# Patient Record
Sex: Female | Born: 1955 | Race: White | Hispanic: No | Smoking: Never smoker
Health system: Southern US, Community
[De-identification: ages and names within clinical notes are randomized; demographics above are authoritative.]

## PROBLEM LIST (undated history)

## (undated) DIAGNOSIS — K219 Gastro-esophageal reflux disease without esophagitis: Secondary | ICD-10-CM

## (undated) DIAGNOSIS — E039 Hypothyroidism, unspecified: Secondary | ICD-10-CM

## (undated) DIAGNOSIS — F32A Depression, unspecified: Secondary | ICD-10-CM

## (undated) DIAGNOSIS — M797 Fibromyalgia: Secondary | ICD-10-CM

## (undated) DIAGNOSIS — I10 Essential (primary) hypertension: Secondary | ICD-10-CM

## (undated) DIAGNOSIS — M3500 Sicca syndrome, unspecified: Secondary | ICD-10-CM

## (undated) DIAGNOSIS — G5600 Carpal tunnel syndrome, unspecified upper limb: Secondary | ICD-10-CM

## (undated) DIAGNOSIS — M1990 Unspecified osteoarthritis, unspecified site: Secondary | ICD-10-CM

## (undated) DIAGNOSIS — F419 Anxiety disorder, unspecified: Secondary | ICD-10-CM

## (undated) DIAGNOSIS — G4730 Sleep apnea, unspecified: Secondary | ICD-10-CM

## (undated) DIAGNOSIS — D649 Anemia, unspecified: Secondary | ICD-10-CM

## (undated) HISTORY — PX: TUBAL LIGATION: SHX77

## (undated) HISTORY — PX: COLONOSCOPY: SHX174

## (undated) HISTORY — PX: WRIST FRACTURE SURGERY: SHX121

## (undated) HISTORY — PX: APPENDECTOMY: SHX54

## (undated) HISTORY — PX: CHOLECYSTECTOMY: SHX55

---

## 2020-08-28 ENCOUNTER — Encounter (HOSPITAL_COMMUNITY)
Admission: RE | Disposition: A | Discharge status: Home / Self Care | Payer: Self-pay | Source: Home / Self Care | Attending: Internal Medicine

## 2020-08-28 ENCOUNTER — Ambulatory Visit (HOSPITAL_COMMUNITY): Payer: Medicare Other | Admitting: Anesthesiology

## 2020-08-28 ENCOUNTER — Ambulatory Visit (HOSPITAL_COMMUNITY): Payer: Medicare Other

## 2020-08-28 ENCOUNTER — Inpatient Hospital Stay (HOSPITAL_COMMUNITY)
Admission: RE | Admit: 2020-08-28 | Discharge: 2020-09-01 | DRG: 159 | Disposition: A | Discharge status: Home / Self Care | Payer: Medicare Other | Attending: Internal Medicine | Admitting: Internal Medicine

## 2020-08-28 ENCOUNTER — Other Ambulatory Visit: Payer: Self-pay

## 2020-08-28 ENCOUNTER — Encounter (HOSPITAL_COMMUNITY): Payer: Self-pay | Admitting: Oral Surgery

## 2020-08-28 ENCOUNTER — Ambulatory Visit: Payer: Self-pay | Admitting: Oral Surgery

## 2020-08-28 DIAGNOSIS — E876 Hypokalemia: Secondary | ICD-10-CM | POA: Diagnosis not present

## 2020-08-28 DIAGNOSIS — Z20822 Contact with and (suspected) exposure to covid-19: Secondary | ICD-10-CM | POA: Diagnosis present

## 2020-08-28 DIAGNOSIS — G4700 Insomnia, unspecified: Secondary | ICD-10-CM | POA: Diagnosis present

## 2020-08-28 DIAGNOSIS — K219 Gastro-esophageal reflux disease without esophagitis: Secondary | ICD-10-CM | POA: Diagnosis present

## 2020-08-28 DIAGNOSIS — Z79890 Hormone replacement therapy (postmenopausal): Secondary | ICD-10-CM

## 2020-08-28 DIAGNOSIS — R197 Diarrhea, unspecified: Secondary | ICD-10-CM | POA: Diagnosis present

## 2020-08-28 DIAGNOSIS — Z9049 Acquired absence of other specified parts of digestive tract: Secondary | ICD-10-CM

## 2020-08-28 DIAGNOSIS — M272 Inflammatory conditions of jaws: Secondary | ICD-10-CM

## 2020-08-28 DIAGNOSIS — J45909 Unspecified asthma, uncomplicated: Secondary | ICD-10-CM | POA: Diagnosis present

## 2020-08-28 DIAGNOSIS — F32A Depression, unspecified: Secondary | ICD-10-CM | POA: Diagnosis present

## 2020-08-28 DIAGNOSIS — E669 Obesity, unspecified: Secondary | ICD-10-CM | POA: Diagnosis present

## 2020-08-28 DIAGNOSIS — E039 Hypothyroidism, unspecified: Secondary | ICD-10-CM | POA: Diagnosis not present

## 2020-08-28 DIAGNOSIS — R000 Tachycardia, unspecified: Secondary | ICD-10-CM | POA: Diagnosis present

## 2020-08-28 DIAGNOSIS — K122 Cellulitis and abscess of mouth: Principal | ICD-10-CM | POA: Diagnosis present

## 2020-08-28 DIAGNOSIS — Z6835 Body mass index (BMI) 35.0-35.9, adult: Secondary | ICD-10-CM

## 2020-08-28 DIAGNOSIS — I10 Essential (primary) hypertension: Secondary | ICD-10-CM | POA: Diagnosis not present

## 2020-08-28 DIAGNOSIS — Z79899 Other long term (current) drug therapy: Secondary | ICD-10-CM

## 2020-08-28 DIAGNOSIS — M797 Fibromyalgia: Secondary | ICD-10-CM | POA: Diagnosis present

## 2020-08-28 DIAGNOSIS — D72829 Elevated white blood cell count, unspecified: Secondary | ICD-10-CM | POA: Diagnosis present

## 2020-08-28 DIAGNOSIS — Z880 Allergy status to penicillin: Secondary | ICD-10-CM

## 2020-08-28 DIAGNOSIS — G4730 Sleep apnea, unspecified: Secondary | ICD-10-CM | POA: Diagnosis present

## 2020-08-28 DIAGNOSIS — D72828 Other elevated white blood cell count: Secondary | ICD-10-CM | POA: Diagnosis not present

## 2020-08-28 DIAGNOSIS — F419 Anxiety disorder, unspecified: Secondary | ICD-10-CM | POA: Diagnosis present

## 2020-08-28 DIAGNOSIS — G8929 Other chronic pain: Secondary | ICD-10-CM | POA: Diagnosis present

## 2020-08-28 DIAGNOSIS — M545 Low back pain, unspecified: Secondary | ICD-10-CM | POA: Diagnosis present

## 2020-08-28 DIAGNOSIS — M3500 Sicca syndrome, unspecified: Secondary | ICD-10-CM | POA: Diagnosis present

## 2020-08-28 HISTORY — DX: Fibromyalgia: M79.7

## 2020-08-28 HISTORY — DX: Unspecified osteoarthritis, unspecified site: M19.90

## 2020-08-28 HISTORY — DX: Depression, unspecified: F32.A

## 2020-08-28 HISTORY — DX: Sjogren syndrome, unspecified: M35.00

## 2020-08-28 HISTORY — DX: Gastro-esophageal reflux disease without esophagitis: K21.9

## 2020-08-28 HISTORY — PX: TOOTH EXTRACTION: SHX859

## 2020-08-28 HISTORY — DX: Hypothyroidism, unspecified: E03.9

## 2020-08-28 HISTORY — DX: Essential (primary) hypertension: I10

## 2020-08-28 HISTORY — DX: Anxiety disorder, unspecified: F41.9

## 2020-08-28 HISTORY — DX: Carpal tunnel syndrome, unspecified upper limb: G56.00

## 2020-08-28 HISTORY — PX: INCISION AND DRAINAGE ABSCESS: SHX5864

## 2020-08-28 HISTORY — PX: DENTAL SURGERY: SHX609

## 2020-08-28 HISTORY — DX: Anemia, unspecified: D64.9

## 2020-08-28 HISTORY — DX: Sleep apnea, unspecified: G47.30

## 2020-08-28 LAB — BASIC METABOLIC PANEL
Anion gap: 12 (ref 5–15)
BUN: 6 mg/dL — ABNORMAL LOW (ref 8–23)
CO2: 25 mmol/L (ref 22–32)
Calcium: 10.3 mg/dL (ref 8.9–10.3)
Chloride: 100 mmol/L (ref 98–111)
Creatinine, Ser: 0.82 mg/dL (ref 0.44–1.00)
GFR, Estimated: >60 mL/min (ref >60)
Glucose, Bld: 115 mg/dL — ABNORMAL HIGH (ref 70–99)
Potassium: 3 mmol/L — ABNORMAL LOW (ref 3.5–5.1)
Sodium: 137 mmol/L (ref 135–145)

## 2020-08-28 LAB — CBC WITH DIFFERENTIAL/PLATELET
Abs Immature Granulocytes: 0.1 10*3/uL — ABNORMAL HIGH (ref 0.00–0.07)
Basophils Absolute: 0.1 10*3/uL (ref 0.0–0.1)
Basophils Relative: 0 %
Eosinophils Absolute: 0 10*3/uL (ref 0.0–0.5)
Eosinophils Relative: 0 %
HCT: 38.8 % (ref 36.0–46.0)
Hemoglobin: 12.2 g/dL (ref 12.0–15.0)
Immature Granulocytes: 1 %
Lymphocytes Relative: 13 %
Lymphs Abs: 1.9 10*3/uL (ref 0.7–4.0)
MCH: 29.1 pg (ref 26.0–34.0)
MCHC: 31.4 g/dL (ref 30.0–36.0)
MCV: 92.6 fL (ref 80.0–100.0)
Monocytes Absolute: 1.2 10*3/uL — ABNORMAL HIGH (ref 0.1–1.0)
Monocytes Relative: 8 %
Neutro Abs: 11.7 10*3/uL — ABNORMAL HIGH (ref 1.7–7.7)
Neutrophils Relative %: 78 %
Platelets: 444 10*3/uL — ABNORMAL HIGH (ref 150–400)
RBC: 4.19 MIL/uL (ref 3.87–5.11)
RDW: 14 % (ref 11.5–15.5)
WBC: 15 10*3/uL — ABNORMAL HIGH (ref 4.0–10.5)
nRBC: 0 % (ref 0.0–0.2)

## 2020-08-28 LAB — SARS CORONAVIRUS 2 BY RT PCR (HOSPITAL ORDER, PERFORMED IN ~~LOC~~ HOSPITAL LAB): SARS Coronavirus 2: NEGATIVE

## 2020-08-28 SURGERY — DENTAL RESTORATION/EXTRACTIONS
Anesthesia: General | Site: Mouth

## 2020-08-28 MED ORDER — LACTATED RINGERS IV SOLN: INTRAVENOUS | Status: DC

## 2020-08-28 MED ORDER — ACETAMINOPHEN 325 MG PO TABS
650.0000 mg | ORAL_TABLET | Freq: Four times a day (QID) | ORAL | Status: DC | PRN
  Administered 2020-08-28 – 2020-08-29 (×2): 650 mg via ORAL
  Filled 2020-08-28 (×2): qty 2

## 2020-08-28 MED ORDER — FENTANYL CITRATE (PF) 100 MCG/2ML IJ SOLN
INTRAMUSCULAR | Status: AC
  Filled 2020-08-28: qty 2

## 2020-08-28 MED ORDER — LIDOCAINE 2% (20 MG/ML) 5 ML SYRINGE
INTRAMUSCULAR | Status: AC
  Filled 2020-08-28: qty 5

## 2020-08-28 MED ORDER — ONDANSETRON HCL 4 MG/2ML IJ SOLN: 4.0000 mg | Freq: Four times a day (QID) | INTRAMUSCULAR | Status: DC | PRN

## 2020-08-28 MED ORDER — PROPOFOL 10 MG/ML IV BOLUS
INTRAVENOUS | Status: DC | PRN
  Administered 2020-08-28: 200 mg via INTRAVENOUS

## 2020-08-28 MED ORDER — 0.9 % SODIUM CHLORIDE (POUR BTL) OPTIME
TOPICAL | Status: DC | PRN
  Administered 2020-08-28: 1000 mL

## 2020-08-28 MED ORDER — CLONAZEPAM 0.5 MG PO TABS: 0.5000 mg | ORAL_TABLET | Freq: Three times a day (TID) | ORAL | Status: DC | PRN

## 2020-08-28 MED ORDER — ENOXAPARIN SODIUM 40 MG/0.4ML ~~LOC~~ SOLN
40.0000 mg | SUBCUTANEOUS | Status: DC
  Administered 2020-08-29 – 2020-09-01 (×4): 40 mg via SUBCUTANEOUS
  Filled 2020-08-28 (×4): qty 0.4

## 2020-08-28 MED ORDER — POTASSIUM CHLORIDE 10 MEQ/100ML IV SOLN
10.0000 meq | INTRAVENOUS | Status: AC
  Administered 2020-08-28 – 2020-08-29 (×4): 10 meq via INTRAVENOUS
  Filled 2020-08-28 (×4): qty 100

## 2020-08-28 MED ORDER — DULOXETINE HCL 60 MG PO CPEP
60.0000 mg | ORAL_CAPSULE | Freq: Two times a day (BID) | ORAL | Status: DC
  Administered 2020-08-28 – 2020-09-01 (×8): 60 mg via ORAL
  Filled 2020-08-28 (×8): qty 1

## 2020-08-28 MED ORDER — SODIUM CHLORIDE 0.9 % IV SOLN
2.0000 g | INTRAVENOUS | Status: DC
  Administered 2020-08-29 – 2020-08-31 (×3): 2 g via INTRAVENOUS
  Filled 2020-08-28 (×4): qty 20

## 2020-08-28 MED ORDER — METRONIDAZOLE IN NACL 5-0.79 MG/ML-% IV SOLN
500.0000 mg | INTRAVENOUS | Status: AC
  Administered 2020-08-28: 500 mg via INTRAVENOUS
  Filled 2020-08-28: qty 100

## 2020-08-28 MED ORDER — AMISULPRIDE (ANTIEMETIC) 5 MG/2ML IV SOLN: 10.0000 mg | Freq: Once | INTRAVENOUS | Status: DC | PRN

## 2020-08-28 MED ORDER — VITAMIN B-12 1000 MCG PO TABS
1000.0000 ug | ORAL_TABLET | Freq: Every day | ORAL | Status: DC
  Administered 2020-08-29 – 2020-09-01 (×4): 1000 ug via ORAL
  Filled 2020-08-28 (×4): qty 1

## 2020-08-28 MED ORDER — ACETAMINOPHEN 10 MG/ML IV SOLN
INTRAVENOUS | Status: DC | PRN
  Administered 2020-08-28: 1000 mg via INTRAVENOUS

## 2020-08-28 MED ORDER — CHLORHEXIDINE GLUCONATE 0.12 % MT SOLN: 15.0000 mL | Freq: Once | OROMUCOSAL | Status: AC

## 2020-08-28 MED ORDER — STERILE WATER FOR IRRIGATION IR SOLN
Status: DC | PRN
  Administered 2020-08-28: 1000 mL

## 2020-08-28 MED ORDER — MIDAZOLAM HCL 2 MG/2ML IJ SOLN
INTRAMUSCULAR | Status: AC
  Filled 2020-08-28: qty 2

## 2020-08-28 MED ORDER — MIDAZOLAM HCL 2 MG/2ML IJ SOLN
INTRAMUSCULAR | Status: DC | PRN
  Administered 2020-08-28: 2 mg via INTRAVENOUS

## 2020-08-28 MED ORDER — PROPOFOL 10 MG/ML IV BOLUS
INTRAVENOUS | Status: AC
  Filled 2020-08-28: qty 20

## 2020-08-28 MED ORDER — VITAMIN D 25 MCG (1000 UNIT) PO TABS
2000.0000 [IU] | ORAL_TABLET | Freq: Every day | ORAL | Status: DC
  Administered 2020-08-29 – 2020-09-01 (×4): 2000 [IU] via ORAL
  Filled 2020-08-28 (×4): qty 2

## 2020-08-28 MED ORDER — SODIUM CHLORIDE 0.9 % IV SOLN
2.0000 g | INTRAVENOUS | Status: AC
  Administered 2020-08-28: 2 g via INTRAVENOUS
  Filled 2020-08-28: qty 20

## 2020-08-28 MED ORDER — DEXAMETHASONE SODIUM PHOSPHATE 10 MG/ML IJ SOLN
INTRAMUSCULAR | Status: DC | PRN
  Administered 2020-08-28: 10 mg via INTRAVENOUS

## 2020-08-28 MED ORDER — SUCCINYLCHOLINE CHLORIDE 200 MG/10ML IV SOSY
PREFILLED_SYRINGE | INTRAVENOUS | Status: DC | PRN
  Administered 2020-08-28: 120 mg via INTRAVENOUS

## 2020-08-28 MED ORDER — FENTANYL CITRATE (PF) 100 MCG/2ML IJ SOLN
25.0000 ug | INTRAMUSCULAR | Status: DC | PRN
  Administered 2020-08-28: 50 ug via INTRAVENOUS

## 2020-08-28 MED ORDER — ONDANSETRON HCL 4 MG/2ML IJ SOLN
INTRAMUSCULAR | Status: AC
  Filled 2020-08-28: qty 2

## 2020-08-28 MED ORDER — FENTANYL CITRATE (PF) 250 MCG/5ML IJ SOLN
INTRAMUSCULAR | Status: DC | PRN
  Administered 2020-08-28 (×2): 50 ug via INTRAVENOUS
  Administered 2020-08-28: 100 ug via INTRAVENOUS
  Administered 2020-08-28: 50 ug via INTRAVENOUS

## 2020-08-28 MED ORDER — FENTANYL CITRATE (PF) 250 MCG/5ML IJ SOLN
INTRAMUSCULAR | Status: AC
  Filled 2020-08-28: qty 5

## 2020-08-28 MED ORDER — OXYCODONE HCL 5 MG PO TABS: 5.0000 mg | ORAL_TABLET | Freq: Once | ORAL | Status: DC | PRN

## 2020-08-28 MED ORDER — POTASSIUM CITRATE ER 10 MEQ (1080 MG) PO TBCR
20.0000 meq | EXTENDED_RELEASE_TABLET | Freq: Three times a day (TID) | ORAL | Status: DC
  Filled 2020-08-28: qty 2

## 2020-08-28 MED ORDER — OXYMETAZOLINE HCL 0.05 % NA SOLN
NASAL | Status: AC
  Filled 2020-08-28: qty 30

## 2020-08-28 MED ORDER — HYDROCHLOROTHIAZIDE 25 MG PO TABS
25.0000 mg | ORAL_TABLET | Freq: Every day | ORAL | Status: DC
  Administered 2020-08-28 – 2020-09-01 (×5): 25 mg via ORAL
  Filled 2020-08-28 (×5): qty 1

## 2020-08-28 MED ORDER — ALBUTEROL SULFATE HFA 108 (90 BASE) MCG/ACT IN AERS
2.0000 | INHALATION_SPRAY | Freq: Four times a day (QID) | RESPIRATORY_TRACT | Status: DC | PRN
  Filled 2020-08-28: qty 6.7

## 2020-08-28 MED ORDER — SUCCINYLCHOLINE CHLORIDE 200 MG/10ML IV SOSY
PREFILLED_SYRINGE | INTRAVENOUS | Status: AC
  Filled 2020-08-28: qty 10

## 2020-08-28 MED ORDER — ROCURONIUM BROMIDE 10 MG/ML (PF) SYRINGE
PREFILLED_SYRINGE | INTRAVENOUS | Status: AC
  Filled 2020-08-28: qty 10

## 2020-08-28 MED ORDER — FENTANYL CITRATE (PF) 100 MCG/2ML IJ SOLN: 25.0000 ug | INTRAMUSCULAR | Status: DC | PRN

## 2020-08-28 MED ORDER — CHLORHEXIDINE GLUCONATE CLOTH 2 % EX PADS: 6.0000 | MEDICATED_PAD | Freq: Once | CUTANEOUS | Status: DC

## 2020-08-28 MED ORDER — KETOROLAC TROMETHAMINE 30 MG/ML IJ SOLN
INTRAMUSCULAR | Status: DC | PRN
  Administered 2020-08-28: 30 mg via INTRAVENOUS

## 2020-08-28 MED ORDER — ORAL CARE MOUTH RINSE
15.0000 mL | Freq: Once | OROMUCOSAL | Status: AC
  Administered 2020-08-28: 15 mL via OROMUCOSAL

## 2020-08-28 MED ORDER — PROMETHAZINE HCL 25 MG/ML IJ SOLN: 6.2500 mg | INTRAMUSCULAR | Status: DC | PRN

## 2020-08-28 MED ORDER — VERAPAMIL HCL ER 120 MG PO TBCR
120.0000 mg | EXTENDED_RELEASE_TABLET | Freq: Every day | ORAL | Status: DC
  Administered 2020-08-28 – 2020-09-01 (×5): 120 mg via ORAL
  Filled 2020-08-28 (×5): qty 1

## 2020-08-28 MED ORDER — KETOROLAC TROMETHAMINE 30 MG/ML IJ SOLN
INTRAMUSCULAR | Status: AC
  Filled 2020-08-28: qty 1

## 2020-08-28 MED ORDER — PANTOPRAZOLE SODIUM 40 MG PO TBEC
40.0000 mg | DELAYED_RELEASE_TABLET | Freq: Every day | ORAL | Status: DC
  Administered 2020-08-28 – 2020-09-01 (×5): 40 mg via ORAL
  Filled 2020-08-28 (×5): qty 1

## 2020-08-28 MED ORDER — DEXAMETHASONE SODIUM PHOSPHATE 10 MG/ML IJ SOLN
INTRAMUSCULAR | Status: AC
  Filled 2020-08-28: qty 1

## 2020-08-28 MED ORDER — ONDANSETRON HCL 4 MG/2ML IJ SOLN
INTRAMUSCULAR | Status: DC | PRN
  Administered 2020-08-28: 4 mg via INTRAVENOUS

## 2020-08-28 MED ORDER — LIDOCAINE-EPINEPHRINE (PF) 2 %-1:200000 IJ SOLN
INTRAMUSCULAR | Status: DC | PRN
  Administered 2020-08-28: 5 mL

## 2020-08-28 MED ORDER — ARIPIPRAZOLE 5 MG PO TABS
15.0000 mg | ORAL_TABLET | Freq: Every day | ORAL | Status: DC
  Administered 2020-08-28 – 2020-09-01 (×5): 15 mg via ORAL
  Filled 2020-08-28 (×5): qty 1

## 2020-08-28 MED ORDER — ONDANSETRON HCL 4 MG PO TABS: 4.0000 mg | ORAL_TABLET | Freq: Four times a day (QID) | ORAL | Status: DC | PRN

## 2020-08-28 MED ORDER — POTASSIUM CHLORIDE CRYS ER 20 MEQ PO TBCR
20.0000 meq | EXTENDED_RELEASE_TABLET | Freq: Three times a day (TID) | ORAL | Status: AC
  Administered 2020-08-29 (×3): 20 meq via ORAL
  Filled 2020-08-28 (×3): qty 1

## 2020-08-28 MED ORDER — WHITE PETROLATUM EX OINT
TOPICAL_OINTMENT | CUTANEOUS | Status: AC
  Filled 2020-08-28: qty 28.35

## 2020-08-28 MED ORDER — LEVOTHYROXINE SODIUM 150 MCG PO TABS
150.0000 ug | ORAL_TABLET | Freq: Every day | ORAL | Status: DC
  Administered 2020-08-29 – 2020-09-01 (×4): 150 ug via ORAL
  Filled 2020-08-28 (×2): qty 1
  Filled 2020-08-28 (×3): qty 2
  Filled 2020-08-28: qty 1
  Filled 2020-08-28: qty 2
  Filled 2020-08-28: qty 1

## 2020-08-28 MED ORDER — LIDOCAINE-EPINEPHRINE 2 %-1:100000 IJ SOLN
INTRAMUSCULAR | Status: AC
  Filled 2020-08-28: qty 1

## 2020-08-28 MED ORDER — ROCURONIUM BROMIDE 10 MG/ML (PF) SYRINGE
PREFILLED_SYRINGE | INTRAVENOUS | Status: DC | PRN
  Administered 2020-08-28: 50 mg via INTRAVENOUS

## 2020-08-28 MED ORDER — SUGAMMADEX SODIUM 200 MG/2ML IV SOLN
INTRAVENOUS | Status: DC | PRN
  Administered 2020-08-28: 200 mg via INTRAVENOUS

## 2020-08-28 MED ORDER — LIDOCAINE 2% (20 MG/ML) 5 ML SYRINGE
INTRAMUSCULAR | Status: DC | PRN
  Administered 2020-08-28: 100 mg via INTRAVENOUS

## 2020-08-28 MED ORDER — ACETAMINOPHEN 10 MG/ML IV SOLN
INTRAVENOUS | Status: AC
  Filled 2020-08-28: qty 100

## 2020-08-28 MED ORDER — OXYCODONE HCL 5 MG/5ML PO SOLN: 5.0000 mg | Freq: Once | ORAL | Status: DC | PRN

## 2020-08-28 MED ORDER — METRONIDAZOLE IN NACL 5-0.79 MG/ML-% IV SOLN
500.0000 mg | Freq: Three times a day (TID) | INTRAVENOUS | Status: DC
  Administered 2020-08-29 – 2020-08-30 (×4): 500 mg via INTRAVENOUS
  Filled 2020-08-28 (×4): qty 100

## 2020-08-28 MED ORDER — IOHEXOL 300 MG/ML  SOLN
75.0000 mL | Freq: Once | INTRAMUSCULAR | Status: AC | PRN
  Administered 2020-08-28: 75 mL via INTRAVENOUS

## 2020-08-28 MED ORDER — TRAZODONE HCL 150 MG PO TABS
300.0000 mg | ORAL_TABLET | Freq: Every day | ORAL | Status: DC
  Administered 2020-08-28 – 2020-08-31 (×4): 300 mg via ORAL
  Filled 2020-08-28 (×5): qty 2

## 2020-08-28 SURGICAL SUPPLY — 30 items
BLADE SURG 15 STRL LF DISP TIS (BLADE) ×2 IMPLANT
BLADE SURG 15 STRL SS (BLADE) ×1
BNDG GAUZE ELAST 4 BULKY (GAUZE/BANDAGES/DRESSINGS) ×3 IMPLANT
BUR CROSS CUT FISSURE 1.6 (BURR) ×3 IMPLANT
CANISTER SUCT 3000ML PPV (MISCELLANEOUS) ×3 IMPLANT
CLEANER TIP ELECTROSURG 2X2 (MISCELLANEOUS) ×3 IMPLANT
COVER SURGICAL LIGHT HANDLE (MISCELLANEOUS) ×3 IMPLANT
DRAIN PENROSE 1/4X12 LTX STRL (WOUND CARE) ×3 IMPLANT
ELECT NEEDLE BLADE 2-5/6 (NEEDLE) ×3 IMPLANT
ELECT REM PT RETURN 9FT ADLT (ELECTROSURGICAL) ×3
ELECTRODE REM PT RTRN 9FT ADLT (ELECTROSURGICAL) ×2 IMPLANT
GLOVE ORTHO TXT STRL SZ7.5 (GLOVE) ×3 IMPLANT
GOWN STRL REUS W/ TWL LRG LVL3 (GOWN DISPOSABLE) ×4 IMPLANT
GOWN STRL REUS W/TWL LRG LVL3 (GOWN DISPOSABLE) ×2
KIT BASIN OR (CUSTOM PROCEDURE TRAY) ×3 IMPLANT
KIT TURNOVER KIT B (KITS) ×3 IMPLANT
NEEDLE 18GX1X1/2 (RX/OR ONLY) (NEEDLE) ×3 IMPLANT
NEEDLE BLUNT 18X1 FOR OR ONLY (NEEDLE) ×3 IMPLANT
NEEDLE PRECISIONGLIDE 27X1.5 (NEEDLE) ×3 IMPLANT
NS IRRIG 1000ML POUR BTL (IV SOLUTION) ×3 IMPLANT
PAD ARMBOARD 7.5X6 YLW CONV (MISCELLANEOUS) ×6 IMPLANT
PENCIL BUTTON HOLSTER BLD 10FT (ELECTRODE) ×3 IMPLANT
STAPLER VISISTAT 35W (STAPLE) ×3 IMPLANT
SUCTION FRAZIER TIP 8 FR DISP (SUCTIONS) ×1
SUCTION TUBE FRAZIER 8FR DISP (SUCTIONS) ×2 IMPLANT
SUT CHROMIC 3 0 SH 27 (SUTURE) ×3 IMPLANT
SUT ETHILON 3 0 PS 1 (SUTURE) ×3 IMPLANT
SYR 50ML LL SCALE MARK (SYRINGE) ×3 IMPLANT
TRAY ENT MC OR (CUSTOM PROCEDURE TRAY) ×3 IMPLANT
WATER STERILE IRR 1000ML POUR (IV SOLUTION) ×3 IMPLANT

## 2020-08-28 NOTE — Procedures (Signed)
PRE-OPERATIVE DIAGNOSIS:  Left submandibular/sublingual infection with carious, non-restorable teeth #19-21, 23-24  POST-OPERATIVE DIAGNOSIS:  Same   SURGEON:  Surgeon(s) and Role:    Ross Marcus, Lavell Anchors, DMD - Primary  Procedure: Surgical extraction teeth #19-21, 23-24  ([id number] x 5) Extra-oral I&D of sublingual abscess (68341) Extra-oral I&D of submandibular abscess (96222)  Description of Operation/Procedure:   The patient was encountered in [facility] Room 8.  General anesthesia was induced and an oral endotracheal tube was secured in the standard fashion.  The table was moved slightly away from anesthesia and the patient was properly padded, relieving all pressure points.  A formal time-out was executed.  2% lidocaine with 1:100,000 epinephrine was infiltrated into the proposed surgical sites.  The patient was prepped and draped in the standard sterile fashion and a throat pack was placed.            Using an 18 gauge needle, aspiration of the swelling was performed via an extraoral approach, yielding no purulence.  Intraoral swabs were taken and were sent for aerobic and anaerobic cultures and a stat Gram stain.   Attention was then directed intraorally to the lower left.  A full thickness mucoperiosteal flap was elevated.  Teeth #19-21, 23-2 were then removed with standard elevator and forceps technique without complication. The extraction sites were thoroughly curetted, bone was smoothed, and the sites thoroughly irrigated.   Transcutaneous drainage sites were then carefully marked beneath the inferior border of the mandible to allow dependent drainage, avoid neurovascular structures, and to promote esthetics.  Incisions were made through skin and subcutaneous tissues and hemostasis was obtained.  The platysma was identified and divided.  Blunt dissection was carried to the mandible with intraoral digital guidance.  The following spaces were bluntly explored yielding purulent drainage:  submandibular, sublingual.   Intraoral access was used to assure proper 1/4'" Penrose drain placement into each fascial space.  A total of 2 drains were placed and secured with 3-0 nylon sutures in the standard fashion to allow for post-surgical advancement when appropriate.  All areas were thoroughly irrigated.  The full thickness mucoperiosteal flap was reapproximated with 3-0 chromic gut suture.  A burn net dressing was fashioned to secure kerlix fluffs over the extraoral drains.   The oral cavity was suctioned free of all debris and secretions.  The teeth were brushed. The throat pack was removed and an orogastric tube was passed to evacuate the stomach contents.  Sponge and needle counts were correct x 2.  Care of the patient was turned over to the Anesthesia team for uneventful extubation and delivery of the patient to the PACU in stable condition.  ANESTHESIA:   general EBL:  50 mL  DRAINS: Penrose drain in the left neck X 2 LOCAL MEDICATIONS USED:  LIDOCAINE 2% w/ 1:100000 epi SPECIMEN:  Aspirate - cultures for anaerobes, aerobes, and fungal PLAN OF CARE: Return to floor PATIENT DISPOSITION:  PACU - hemodynamically stable. Delay start of Pharmacological VTE agent (>24hrs) due to surgical blood loss or risk of bleeding: no  OMFS Recommendations -Liquid diet POD #0, advance to soft mechanical diet POD#1 -Peridex (chlorohexidine) mouthrnise QID -Change Kerlix dressing as needed -Continue Ceftriaxone + Flagyl -Follow cultures -Daily CBC w/ diff

## 2020-08-28 NOTE — H&P (Addendum)
History and Physical   Kim Gordon [facility] FTD:[id number] DOB: [birth year] DOA: 08/28/2020  PCP: Majel Homer, MD  Patient coming from: Home  I have personally briefly reviewed patient's old medical records in [facility] EMR.  Chief Concern: tooth ache  HPI: Kim Gordon is a 64 y.o. female with medical history significant for hypertension, hypothyroid, obesity, anxiety/depression presented for chief concern of tooth pain in the left lower jaw.  She reports this has been ongoing for several weeks.  She reports that her tooth broke.  She does not know why her tooth broke as she did not eat anything hard.  Review of system was negative for fever, chills, headache, vision changes, nausea, vomiting, chest pain, shortness of breath, abdominal pain, constipation, dysuria, hematuria, insomnia.  She endorses chronic low back pain and watery stools for 3 to 4 days due to only drinking liquids.  She reports this is never happened before.  She was taken to the OR by Dr. Ross Marcus.  She states that she did not take any of her medications this morning including her blood pressure medicine or her thyroid medicine.  Social history: Denies tobacco use, alcohol use and recreational drug use.  Discussed with dental surgeon requesting admission for medical management  Review of Systems: As per HPI otherwise 10 point review of systems negative.   Past Medical History:  Diagnosis Date  . Anemia   . Anxiety   . Arthritis   . Carpal tunnel syndrome   . Depression   . Fibromyalgia   . GERD (gastroesophageal reflux disease)   . Hypertension   . Hypothyroidism   . Sjogren's syndrome (HCC)   . Sleep apnea    Past Surgical History:  Procedure Laterality Date  . APPENDECTOMY    . CHOLECYSTECTOMY    . COLONOSCOPY    . TUBAL LIGATION    . WRIST FRACTURE SURGERY Left    Social History:  reports that she has never smoked. She has never used smokeless tobacco. She reports that she does not drink alcohol  and does not use drugs.  Allergies  Allergen Reactions  . Drug Ingredient [Sulfamethoxazole-Trimethoprim] Hives  . Penicillins Hives   History reviewed. No pertinent family history. Family history: Family history reviewed and no periodontal abscess  Prior to Admission medications   Medication Sig Start Date End Date Taking? Authorizing Provider  ARIPiprazole (ABILIFY) 15 MG tablet Take 15 mg by mouth daily. 02/24/20  Yes [provider]  benztropine (COGENTIN) 0.5 MG tablet Take 0.5 mg by mouth in the morning and at bedtime. 06/22/19  Yes [provider]  Cholecalciferol 50 MCG (2000 UT) CAPS Take 2,000 Units by mouth daily. 07/05/19  Yes [provider]  clonazePAM (KLONOPIN) 0.5 MG tablet Take 0.5 mg by mouth 3 (three) times daily as needed for anxiety. 08/24/20  Yes [provider]  cyanocobalamin 1000 MCG tablet Take 1,000 mcg by mouth daily. 07/05/19  Yes [provider]  DULoxetine (CYMBALTA) 60 MG capsule Take 60 mg by mouth 2 (two) times daily. 05/03/20  Yes [provider]  esomeprazole (NEXIUM) 40 MG capsule Take 40 mg by mouth daily. 07/24/20  Yes [provider]  HYDROcodone-acetaminophen (NORCO) 7.5-325 MG tablet Take 1 tablet by mouth every 6 (six) hours as needed for pain. 08/24/20  Yes [provider]  levothyroxine (SYNTHROID) 150 MCG tablet Take 150 mcg by mouth daily. 08/27/20  Yes [provider]  trazodone (DESYREL) 300 MG tablet Take 300 mg by mouth at  bedtime. 03/29/20  Yes [provider]  verapamil (CALAN-SR) 120 MG CR tablet Take 120 mg by mouth daily.   Yes [provider]  albuterol (VENTOLIN HFA) 108 (90 Base) MCG/ACT inhaler Inhale 2 puffs into the lungs every 6 (six) hours as needed for wheezing or shortness of breath. 04/15/19   [provider]  hydrochlorothiazide (HYDRODIURIL) 25 MG tablet Take 25 mg by mouth daily. 02/24/20   [provider]   Physical  Exam: Vitals:   08/28/20 1327 08/28/20 1739 08/28/20 1745  BP: (!) 157/79 (!) 180/76 (!) 164/88  Pulse: 79 93 88  Resp: 18 (!) 21 13  Temp: 97.9 F (36.6 C) 98 F (36.7 C)   TempSrc: Temporal    SpO2: 97% 100% 100%  Weight: 99.8 kg    Height: 5\' 6"  (1.676 m)     Constitutional: NAD, calm, comfortable Eyes: PERRL, lids and conjunctivae normal ENMT: Mucous membranes are moist. Posterior pharynx clear of any exudate or lesions. Poor dentition with multiple caries. Packing in place in the left posterior mandibular space with drains through to the external facial skin of the left jaw Neck: normal, supple, no masses, no thyromegaly Respiratory: clear to auscultation bilaterally, no wheezing, no crackles. Normal respiratory effort. No accessory muscle use.  Cardiovascular: Regular rate and rhythm, no murmurs / rubs / gallops. No extremity edema. 2+ pedal pulses. No carotid bruits.  Abdomen: Obese abdomen no tenderness, no masses palpated. No hepatosplenomegaly. Bowel sounds positive.  Musculoskeletal: no clubbing / cyanosis. No joint deformity upper and lower extremities. Good ROM, no contractures. Normal muscle tone.  Skin: no rashes, lesions, ulcers. No induration Neurologic: CN 2-12 grossly intact. Sensation intact. Strength 5/5 in all 4.  Psychiatric: Normal judgment and insight. Alert and oriented x 3. Normal mood.   Labs on Admission: I have personally reviewed following labs and imaging studies  CBC: Recent Labs  Lab 08/28/20 1318  WBC 15.0*  NEUTROABS 11.7*  HGB 12.2  HCT 38.8  MCV 92.6  PLT 444*   Basic Metabolic Panel: Recent Labs  Lab 08/28/20 1318  NA 137  K 3.0*  CL 100  CO2 25  GLUCOSE 115*  BUN 6*  CREATININE 0.82  CALCIUM 10.3   Radiological Exams on Admission: Personally reviewed and I agree with radiologist reading as below.  DG Orthopantogram  Result Date: 08/28/2020 CLINICAL DATA:  Infection.  Abscesses. EXAM: ORTHOPANTOGRAM/PANORAMIC COMPARISON:   None. FINDINGS: Maxillary dentition: 1 and 2 absent. Previous root canal tooth 4 with advanced decay. Advanced decay of tooth 6. Missing teeth 13 through 16. Mandibular dentition: Crown tooth 17. Previous root canals at 18 and 19 with advanced decay. Advanced decay of tooth 20 with periapical lucency. Advanced decay teeth 23 and 24. Advanced decay tooth 25. Advanced decay tooth 28 with periapical lucency. Teeth 29 through 32 absent. IMPRESSION: Advanced dental and periodontal disease as above. Electronically Signed   By: 13/11/2019 M.D.   On: 08/28/2020 14:16   CT SOFT TISSUE NECK W CONTRAST  Result Date: 08/28/2020 CLINICAL DATA:  Left neck abscess EXAM: CT NECK WITH CONTRAST TECHNIQUE: Multidetector CT imaging of the neck was performed using the standard protocol following the bolus administration of intravenous contrast. CONTRAST:  86mL OMNIPAQUE IOHEXOL 300 MG/ML  SOLN COMPARISON:  None. FINDINGS: Pharynx and larynx: Inflammatory process detailed below is contiguous with the lateral left oropharyngeal wall. Otherwise unremarkable. Airway is patent. Salivary glands: Normal left submandibular gland is not seen. There is ill-defined soft tissue enhancement  within the submandibular space also involving the sublingual space. Infiltration of surrounding fat. There is a 2 x 1 x 1.1 cm peripherally enhancing left sublingual collection. More posterior superiorly there may be another developing collection seen on series 3, image 72. Left mandibular tooth decay is present without periapical lucency or dehiscence. Both parotid and right submandibular glands are unremarkable apart from fatty replacement. Thyroid: Normal. Lymph nodes: Nonenlarged but increased number of cervical lymph nodes are likely reactive. Vascular: Major neck vessels are patent. Limited intracranial: No abnormal enhancement. Visualized orbits: Unremarkable. Mastoids and visualized paranasal sinuses: No significant opacification. Skeleton:  Degenerative changes of the cervical spine. Degenerative changes at the right temporomandibular joint. Upper chest: No apical lung mass. Other: None. IMPRESSION: Left submandibular and sublingual phlegmon with 2 cm left sublingual abscess. Possible additional developing abscess more posterosuperiorly. Electronically Signed   By: Guadlupe Spanish M.D.   On: 08/28/2020 15:38   EKG: Independently reviewed, showing normal sinus rhythm with good R wave progression and a sinus rate of 85  Assessment/Plan  Active Problems:   Mouth abscess   Essential hypertension   Hypokalemia   Leukocytosis   Anxiety   Hypothyroid   Leukocytosis Advanced dental and periodontal disease Left submandibular and sublingual phlegmon with 2 cm left sublingual abscess -Status post debridement by dental surgeon -Given patient's nonlife-threatening allergy to penicillin, we will cover broadly with ceftriaxone and metronidazole pending culture results -Culture collected in the OR -Dental surgeon states he will follow patient daily  Pain control-fentanyl 25-50 MCG, IV every hour (up to 150 MCG) for pain  Hypokalemia-suspect secondary to hydrochlorothiazide versus poor p.o. intake due to mouth pain -Replace with IV potassium for 4 doses today, and p.o. potassium for 1 day tomorrow -EKG reviewed no ST changes -We will check magnesium level  Sinus tachycardia-suspect secondary to surgical recovery, telemetry  Hypertension-elevated -Resume home hydrochlorothiazide 25 mg oral daily, verapamil tablet 120 mg daily  Hypothyroid-checking TSH, resumed home levothyroxine 150 MCG daily in the a.m.  Anxiety/depression-resume omeprazole tablet 15 mg daily, and duloxetine 60 mg p.o. daily twice daily -Clonazepam 0.5 mg 3 times daily as needed for anxiety  Insomnia-trazodone 300 mg p.o. nightly  History of asthma-currently not in exacerbation, home albuterol as needed for wheezing shortness of breath  Diarrhea-likely  functional, will continue to monitor -Low clinical suspicion for infectious etiology at this time  DVT prophylaxis: Enoxaparin Code Status: Full code Diet: Full liquid tonight, soft diet tomorrow Disposition Plan: Pending clinical course Consults called: Dental surgeon Admission status: Observation to surgical with telemetry  Arthurine Oleary N Temesha Queener D.O. [facility]  If 7AM-7PM, please contact day-coverage provider [email or web address]  08/28/2020, 6:31 PM

## 2020-08-28 NOTE — Anesthesia Procedure Notes (Signed)
Procedure Name: Intubation Date/Time: 08/28/2020 4:27 PM Performed by: Rosiland Oz, CRNA Pre-anesthesia Checklist: Patient identified, Emergency Drugs available, Suction available, Patient being monitored and Timeout performed Patient Re-evaluated:Patient Re-evaluated prior to induction Oxygen Delivery Method: Circle system utilized Preoxygenation: Pre-oxygenation with 100% oxygen Induction Type: IV induction Ventilation: Mask ventilation without difficulty Laryngoscope Size: Miller and 3 Grade View: Grade I Tube type: Oral Tube size: 7.0 mm Number of attempts: 1 Airway Equipment and Method: Stylet Placement Confirmation: ETT inserted through vocal cords under direct vision,  positive ETCO2 and breath sounds checked- equal and bilateral Secured at: 21 cm Tube secured with: Tape Dental Injury: Teeth and Oropharynx as per pre-operative assessment

## 2020-08-28 NOTE — Anesthesia Preprocedure Evaluation (Addendum)
Anesthesia Evaluation  Patient identified by MRN, date of birth, ID band  Reviewed: Allergy & Precautions, NPO status , Patient's Chart, lab work & pertinent test results  Airway Mallampati: III      Comment: Exam limited by pain Dental  (+) Missing, Poor Dentition, Chipped,  Multiple broken teeth. Very poor dentition. Patient states none are loose:   Pulmonary sleep apnea ,    Pulmonary exam normal breath sounds clear to auscultation       Cardiovascular hypertension,  Rhythm:Regular Rate:Normal  28-Aug-2020 13:13:57 [facility] ROUTINE RECORD Normal sinus rhythm Normal ECG   Neuro/Psych PSYCHIATRIC DISORDERS Anxiety Depression    GI/Hepatic GERD  ,  Endo/Other  Hypothyroidism Morbid obesity  Renal/GU   negative genitourinary   Musculoskeletal  (+) Arthritis , Osteoarthritis,  Fibromyalgia -  Abdominal (+) + obese,   Peds negative pediatric ROS (+)  Hematology   Anesthesia Other Findings   Reproductive/Obstetrics negative OB ROS                            Anesthesia Physical Anesthesia Plan  ASA: III and emergent  Anesthesia Plan: General   Post-op Pain Management:    Induction:   PONV Risk Score and Plan: 3 and Ondansetron and Dexamethasone  Airway Management Planned: Oral ETT and Video Laryngoscope Planned  Additional Equipment:   Intra-op Plan:   Post-operative Plan:   Informed Consent: I have reviewed the patients History and Physical, chart, labs and discussed the procedure including the risks, benefits and alternatives for the proposed anesthesia with the patient or authorized representative who has indicated his/her understanding and acceptance.       Plan Discussed with: CRNA and Anesthesiologist  Anesthesia Plan Comments:        Anesthesia Quick Evaluation

## 2020-08-28 NOTE — Transfer of Care (Signed)
Immediate Anesthesia Transfer of Care Note  Patient: Chardonnay Holzmann Balthaser  Procedure(s) Performed: DENTAL EXTRACTION OF TEETH NUMBER NINETEEN, TWENTY, TWENTY-ONE, TWENTY-THREE, AND TWENTY-FOUR (N/A Mouth) INCISION AND DRAINAGE FACIAL ABSCESS (N/A Face)  Patient Location: PACU  Anesthesia Type:General  Level of Consciousness: awake, alert , oriented and patient cooperative  Airway & Oxygen Therapy: Patient Spontanous Breathing and Patient connected to face mask oxygen  Post-op Assessment: Report given to RN and Post -op Vital signs reviewed and stable  Post vital signs: Reviewed and stable  Last Vitals:  Vitals Value Taken Time  BP 180/76 08/28/20 1739  Temp 36.7 C 08/28/20 1739  Pulse 91 08/28/20 1745  Resp 29 08/28/20 1745  SpO2 100 % 08/28/20 1745  Vitals shown include unvalidated device data.  Last Pain:  Vitals:   08/28/20 1404  TempSrc:   PainSc: 8          Complications: No complications documented.

## 2020-08-28 NOTE — H&P (Addendum)
Kim Gordon is an 64 y.o. female with left submandibular/sublingual space odontogenic infection.  ID: Patient referred  for evaluation of swelling of the left face/submandibular area  HPI: The patient reports pain and swelling that began weeks ago on the left side of her face and has progressively gotten worse.  The patient has taken a course of Clindamycin without much benefit. The patient reports trismus and dysphagia, denies fever or dyspnea.    Past Medical History:  Diagnosis Date  . Anemia   . Anxiety   . Arthritis   . Carpal tunnel syndrome   . Depression   . Fibromyalgia   . GERD (gastroesophageal reflux disease)   . Hypertension   . Hypothyroidism   . Sjogren's syndrome (HCC)   . Sleep apnea     Past Surgical History:  Procedure Laterality Date  . APPENDECTOMY    . CHOLECYSTECTOMY    . COLONOSCOPY    . TUBAL LIGATION    . WRIST FRACTURE SURGERY Left     History reviewed. No pertinent family history. Social History:  reports that she has never smoked. She has never used smokeless tobacco. She reports that she does not drink alcohol and does not use drugs.  Allergies:  Allergies  Allergen Reactions  . Drug Ingredient [Sulfamethoxazole-Trimethoprim] Hives  . Penicillins Hives    Medications Prior to Admission  Medication Sig Dispense Refill  . ARIPiprazole (ABILIFY) 15 MG tablet Take 15 mg by mouth daily.    . benztropine (COGENTIN) 0.5 MG tablet Take 0.5 mg by mouth in the morning and at bedtime.    . Cholecalciferol 50 MCG (2000 UT) CAPS Take 2,000 Units by mouth daily.    . clonazePAM (KLONOPIN) 0.5 MG tablet Take 0.5 mg by mouth 3 (three) times daily as needed for anxiety.    . cyanocobalamin 1000 MCG tablet Take 1,000 mcg by mouth daily.    . DULoxetine (CYMBALTA) 60 MG capsule Take 60 mg by mouth 2 (two) times daily.    Marland Kitchen esomeprazole (NEXIUM) 40 MG capsule Take 40 mg by mouth daily.    Marland Kitchen HYDROcodone-acetaminophen (NORCO) 7.5-325 MG tablet Take 1  tablet by mouth every 6 (six) hours as needed for pain.    Marland Kitchen levothyroxine (SYNTHROID) 150 MCG tablet Take 150 mcg by mouth daily.    . trazodone (DESYREL) 300 MG tablet Take 300 mg by mouth at bedtime.    . verapamil (CALAN-SR) 120 MG CR tablet Take 120 mg by mouth daily.    Marland Kitchen albuterol (VENTOLIN HFA) 108 (90 Base) MCG/ACT inhaler Inhale 2 puffs into the lungs every 6 (six) hours as needed for wheezing or shortness of breath.    . hydrochlorothiazide (HYDRODIURIL) 25 MG tablet Take 25 mg by mouth daily.      Results for orders placed or performed during the hospital encounter of 08/28/20 (from the past 48 hour(s))  SARS Coronavirus 2 by RT PCR (hospital order, performed in [facility] hospital lab) Nasopharyngeal Nasopharyngeal Swab     Status: None   Collection Time: 08/28/20  1:11 PM   Specimen: Nasopharyngeal Swab  Result Value Ref Range   SARS Coronavirus 2 NEGATIVE NEGATIVE    Comment: (NOTE) SARS-CoV-2 target nucleic acids are NOT DETECTED.  The SARS-CoV-2 RNA is generally detectable in upper and lower respiratory specimens during the acute phase of infection. The lowest concentration of SARS-CoV-2 viral copies this assay can detect is 250 copies / mL. A negative result does not preclude SARS-CoV-2 infection and should  not be used as the sole basis for treatment or other patient management decisions.  A negative result may occur with improper specimen collection / handling, submission of specimen other than nasopharyngeal swab, presence of viral mutation(s) within the areas targeted by this assay, and inadequate number of viral copies (<250 copies / mL). A negative result must be combined with clinical observations, patient history, and epidemiological information.  Fact Sheet for Patients:   BoilerBrush.com.cy  Fact Sheet for Healthcare Providers: https://pope.com/  This test is not yet approved or  cleared by the [place]  FDA and has been authorized for detection and/or diagnosis of SARS-CoV-2 by FDA under an Emergency Use Authorization (EUA).  This EUA will remain in effect (meaning this test can be used) for the duration of the COVID-19 declaration under Section 564(b)(1) of the Act, 21 U.S.C. section 360bbb-3(b)(1), unless the authorization is terminated or revoked sooner.  Performed at [facility], [address], [place], [place] [address]   Basic metabolic panel     Status: Abnormal   Collection Time: 08/28/20  1:18 PM  Result Value Ref Range   Sodium 137 135 - 145 mmol/L   Potassium 3.0 (L) 3.5 - 5.1 mmol/L   Chloride 100 98 - 111 mmol/L   CO2 25 22 - 32 mmol/L   Glucose, Bld 115 (H) 70 - 99 mg/dL    Comment: Glucose reference range applies only to samples taken after fasting for at least 8 hours.   BUN 6 (L) 8 - 23 mg/dL   Creatinine, Ser 3.81 0.44 - 1.00 mg/dL   Calcium 82.9 8.9 - 93.7 mg/dL   GFR, Estimated >16 >96 mL/min    Comment: (NOTE) Calculated using the CKD-EPI Creatinine Equation (2021)    Anion gap 12 5 - 15    Comment: Performed at [facility], [address] [place], [place] [address]  CBC WITH DIFFERENTIAL     Status: Abnormal   Collection Time: 08/28/20  1:18 PM  Result Value Ref Range   WBC 15.0 (H) 4.0 - 10.5 K/uL   RBC 4.19 3.87 - 5.11 MIL/uL   Hemoglobin 12.2 12.0 - 15.0 g/dL   HCT 10.1 36 - 46 %   MCV 92.6 80.0 - 100.0 fL   MCH 29.1 26.0 - 34.0 pg   MCHC 31.4 30.0 - 36.0 g/dL   RDW 75.1 02.5 - 85.2 %   Platelets 444 (H) 150 - 400 K/uL   nRBC 0.0 0.0 - 0.2 %   Neutrophils Relative % 78 %   Neutro Abs 11.7 (H) 1.7 - 7.7 K/uL   Lymphocytes Relative 13 %   Lymphs Abs 1.9 0.7 - 4.0 K/uL   Monocytes Relative 8 %   Monocytes Absolute 1.2 (H) 0.1 - 1.0 K/uL   Eosinophils Relative 0 %   Eosinophils Absolute 0.0 0.0 - 0.5 K/uL   Basophils Relative 0 %   Basophils Absolute 0.1 0.0 - 0.1 K/uL   Immature Granulocytes 1 %   Abs Immature Granulocytes  0.10 (H) 0.00 - 0.07 K/uL    Comment: Performed at [facility], [address] [place], [place] [address]   DG Orthopantogram  Result Date: 08/28/2020 CLINICAL DATA:  Infection.  Abscesses. EXAM: ORTHOPANTOGRAM/PANORAMIC COMPARISON:  None. FINDINGS: Maxillary dentition: 1 and 2 absent. Previous root canal tooth 4 with advanced decay. Advanced decay of tooth 6. Missing teeth 13 through 16. Mandibular dentition: Crown tooth 17. Previous root canals at 18 and 19  with advanced decay. Advanced decay of tooth 20 with periapical lucency. Advanced decay teeth 23 and 24. Advanced decay tooth 25. Advanced decay tooth 28 with periapical lucency. Teeth 29 through 32 absent. IMPRESSION: Advanced dental and periodontal disease as above. Electronically Signed   By: Paulina Fusi M.D.   On: 08/28/2020 14:16   CT SOFT TISSUE NECK W CONTRAST  Result Date: 08/28/2020 CLINICAL DATA:  Left neck abscess EXAM: CT NECK WITH CONTRAST TECHNIQUE: Multidetector CT imaging of the neck was performed using the standard protocol following the bolus administration of intravenous contrast. CONTRAST:  7mL OMNIPAQUE IOHEXOL 300 MG/ML  SOLN COMPARISON:  None. FINDINGS: Pharynx and larynx: Inflammatory process detailed below is contiguous with the lateral left oropharyngeal wall. Otherwise unremarkable. Airway is patent. Salivary glands: Normal left submandibular gland is not seen. There is ill-defined soft tissue enhancement within the submandibular space also involving the sublingual space. Infiltration of surrounding fat. There is a 2 x 1 x 1.1 cm peripherally enhancing left sublingual collection. More posterior superiorly there may be another developing collection seen on series 3, image 72. Left mandibular tooth decay is present without periapical lucency or dehiscence. Both parotid and right submandibular glands are unremarkable apart from fatty replacement. Thyroid: Normal. Lymph nodes: Nonenlarged but increased number of  cervical lymph nodes are likely reactive. Vascular: Major neck vessels are patent. Limited intracranial: No abnormal enhancement. Visualized orbits: Unremarkable. Mastoids and visualized paranasal sinuses: No significant opacification. Skeleton: Degenerative changes of the cervical spine. Degenerative changes at the right temporomandibular joint. Upper chest: No apical lung mass. Other: None. IMPRESSION: Left submandibular and sublingual phlegmon with 2 cm left sublingual abscess. Possible additional developing abscess more posterosuperiorly. Electronically Signed   By: Guadlupe Spanish M.D.   On: 08/28/2020 15:38    Review of Systems  HENT: Positive for facial swelling and trouble swallowing. Negative for mouth sores.   All other systems reviewed and are negative.   Blood pressure (!) 157/79, pulse 79, temperature 97.9 F (36.6 C), temperature source Temporal, resp. rate 18, height 5\' 6"  (1.676 m), weight 99.8 kg, SpO2 97 %. Physical Exam Constitutional:      General: She is awake.     Appearance: Normal appearance.  HENT:     Head: Normocephalic.     Jaw: Trismus, tenderness, swelling and pain on movement present.     Salivary Glands: Left salivary gland is tender.     Mouth/Throat:     Dentition: Dental tenderness, dental caries and dental abscesses present.     Palate: No mass.     Pharynx: Oropharynx is clear. No pharyngeal swelling.  Cardiovascular:     Rate and Rhythm: Normal rate and regular rhythm.  Pulmonary:     Effort: Pulmonary effort is normal.     Breath sounds: Normal breath sounds.  Musculoskeletal:     Cervical back: Normal range of motion. Edema and erythema present.  Neurological:     Mental Status: She is alert.  Psychiatric:        Behavior: Behavior is cooperative.      Assessment/Plan 34 yoF with left submandibular, sublingual space odontogenic infection with resulting trismus and dysphagia with multiple carious, non-restorable teeth. Plan to take patient to  OR today for I&D of abscess with extraction of teeth as necessary.   OMFS Recommendation -Obtain CBC, BMP, and other baseline labs  -- WBC elevated, hypokalemic -Obtain CT scan (Larynx) w/ contrast prior to OR  -- Consistent with left submandibular cellulitis with sublingual fluid  collection and developing submandibular fluid collection -Obtain panorex (orthopantogram) prior to OR -Start patient on IV ceftriaxone + flagyl -COVID test and keep NPO prior to OR today; scheduled 08/28/20 at 1600  -- COVID negative -Admit to internal medicine, plan to keep inpatient on IV antibiotics for a few days pending clinical course  Enis Slipperonner J Sabriya Yono, DMD 08/28/2020, 4:04 PM

## 2020-08-29 ENCOUNTER — Encounter (HOSPITAL_COMMUNITY): Payer: Self-pay | Admitting: Internal Medicine

## 2020-08-29 DIAGNOSIS — E669 Obesity, unspecified: Secondary | ICD-10-CM | POA: Diagnosis present

## 2020-08-29 DIAGNOSIS — F419 Anxiety disorder, unspecified: Secondary | ICD-10-CM | POA: Diagnosis present

## 2020-08-29 DIAGNOSIS — Z79899 Other long term (current) drug therapy: Secondary | ICD-10-CM | POA: Diagnosis not present

## 2020-08-29 DIAGNOSIS — Z880 Allergy status to penicillin: Secondary | ICD-10-CM | POA: Diagnosis not present

## 2020-08-29 DIAGNOSIS — G4700 Insomnia, unspecified: Secondary | ICD-10-CM | POA: Diagnosis present

## 2020-08-29 DIAGNOSIS — E876 Hypokalemia: Secondary | ICD-10-CM | POA: Diagnosis present

## 2020-08-29 DIAGNOSIS — K219 Gastro-esophageal reflux disease without esophagitis: Secondary | ICD-10-CM | POA: Diagnosis present

## 2020-08-29 DIAGNOSIS — Z20822 Contact with and (suspected) exposure to covid-19: Secondary | ICD-10-CM | POA: Diagnosis present

## 2020-08-29 DIAGNOSIS — R197 Diarrhea, unspecified: Secondary | ICD-10-CM | POA: Diagnosis present

## 2020-08-29 DIAGNOSIS — M272 Inflammatory conditions of jaws: Secondary | ICD-10-CM

## 2020-08-29 DIAGNOSIS — M797 Fibromyalgia: Secondary | ICD-10-CM | POA: Diagnosis present

## 2020-08-29 DIAGNOSIS — K122 Cellulitis and abscess of mouth: Secondary | ICD-10-CM | POA: Diagnosis present

## 2020-08-29 DIAGNOSIS — M3500 Sicca syndrome, unspecified: Secondary | ICD-10-CM | POA: Diagnosis present

## 2020-08-29 DIAGNOSIS — J45909 Unspecified asthma, uncomplicated: Secondary | ICD-10-CM | POA: Diagnosis present

## 2020-08-29 DIAGNOSIS — I10 Essential (primary) hypertension: Secondary | ICD-10-CM | POA: Diagnosis present

## 2020-08-29 DIAGNOSIS — M545 Low back pain, unspecified: Secondary | ICD-10-CM | POA: Diagnosis present

## 2020-08-29 DIAGNOSIS — E039 Hypothyroidism, unspecified: Secondary | ICD-10-CM | POA: Diagnosis present

## 2020-08-29 DIAGNOSIS — R000 Tachycardia, unspecified: Secondary | ICD-10-CM | POA: Diagnosis present

## 2020-08-29 DIAGNOSIS — F32A Depression, unspecified: Secondary | ICD-10-CM | POA: Diagnosis present

## 2020-08-29 DIAGNOSIS — Z9049 Acquired absence of other specified parts of digestive tract: Secondary | ICD-10-CM | POA: Diagnosis not present

## 2020-08-29 DIAGNOSIS — Z79890 Hormone replacement therapy (postmenopausal): Secondary | ICD-10-CM | POA: Diagnosis not present

## 2020-08-29 DIAGNOSIS — G4730 Sleep apnea, unspecified: Secondary | ICD-10-CM | POA: Diagnosis present

## 2020-08-29 DIAGNOSIS — Z6835 Body mass index (BMI) 35.0-35.9, adult: Secondary | ICD-10-CM | POA: Diagnosis not present

## 2020-08-29 DIAGNOSIS — G8929 Other chronic pain: Secondary | ICD-10-CM | POA: Diagnosis present

## 2020-08-29 LAB — MAGNESIUM: Magnesium: 1.9 mg/dL (ref 1.7–2.4)

## 2020-08-29 LAB — COMPREHENSIVE METABOLIC PANEL
ALT: 11 U/L (ref 0–44)
AST: 16 U/L (ref 15–41)
Albumin: 3 g/dL — ABNORMAL LOW (ref 3.5–5.0)
Alkaline Phosphatase: 94 U/L (ref 38–126)
Anion gap: 10 (ref 5–15)
BUN: 5 mg/dL — ABNORMAL LOW (ref 8–23)
CO2: 27 mmol/L (ref 22–32)
Calcium: 10.5 mg/dL — ABNORMAL HIGH (ref 8.9–10.3)
Chloride: 101 mmol/L (ref 98–111)
Creatinine, Ser: 0.84 mg/dL (ref 0.44–1.00)
GFR, Estimated: >60 mL/min (ref >60)
Glucose, Bld: 123 mg/dL — ABNORMAL HIGH (ref 70–99)
Potassium: 3.7 mmol/L (ref 3.5–5.1)
Sodium: 138 mmol/L (ref 135–145)
Total Bilirubin: 0.3 mg/dL (ref 0.3–1.2)
Total Protein: 7.5 g/dL (ref 6.5–8.1)

## 2020-08-29 LAB — TSH: TSH: 5.698 u[IU]/mL — ABNORMAL HIGH (ref 0.350–4.500)

## 2020-08-29 LAB — CBC
HCT: 37.4 % (ref 36.0–46.0)
Hemoglobin: 11.9 g/dL — ABNORMAL LOW (ref 12.0–15.0)
MCH: 29.2 pg (ref 26.0–34.0)
MCHC: 31.8 g/dL (ref 30.0–36.0)
MCV: 91.7 fL (ref 80.0–100.0)
Platelets: 459 10*3/uL — ABNORMAL HIGH (ref 150–400)
RBC: 4.08 MIL/uL (ref 3.87–5.11)
RDW: 13.9 % (ref 11.5–15.5)
WBC: 18.4 10*3/uL — ABNORMAL HIGH (ref 4.0–10.5)
nRBC: 0 % (ref 0.0–0.2)

## 2020-08-29 LAB — HIV ANTIBODY (ROUTINE TESTING W REFLEX): HIV Screen 4th Generation wRfx: NONREACTIVE

## 2020-08-29 MED ORDER — HYDROCODONE-ACETAMINOPHEN 5-325 MG PO TABS
1.0000 | ORAL_TABLET | ORAL | Status: DC | PRN
  Administered 2020-08-29 – 2020-09-01 (×9): 1 via ORAL
  Filled 2020-08-29 (×9): qty 1

## 2020-08-29 MED ORDER — ACETAMINOPHEN 325 MG PO TABS
650.0000 mg | ORAL_TABLET | Freq: Four times a day (QID) | ORAL | Status: DC | PRN
  Administered 2020-08-29: 650 mg via ORAL
  Filled 2020-08-29: qty 2

## 2020-08-29 MED ORDER — KETOROLAC TROMETHAMINE 30 MG/ML IJ SOLN
INTRAMUSCULAR | Status: AC
  Filled 2020-08-29: qty 1

## 2020-08-29 MED ORDER — KETOROLAC TROMETHAMINE 30 MG/ML IJ SOLN: 30.0000 mg | Freq: Four times a day (QID) | INTRAMUSCULAR | Status: DC | PRN

## 2020-08-29 MED ORDER — MORPHINE SULFATE (PF) 2 MG/ML IV SOLN
2.0000 mg | INTRAVENOUS | Status: DC | PRN
  Administered 2020-08-29 – 2020-08-31 (×3): 2 mg via INTRAVENOUS
  Filled 2020-08-29 (×3): qty 1

## 2020-08-29 NOTE — Progress Notes (Addendum)
I did change patient dressing on the neck because it was soaked with drainage from the tubes, just  2 4x4 and kirlex, patient tolerate it well, pain medicine tylenol given prior, will continue to monitor thanks!

## 2020-08-29 NOTE — Anesthesia Postprocedure Evaluation (Signed)
Anesthesia Post Note  Patient: [name]  Procedure(s) Performed: DENTAL EXTRACTION OF TEETH NUMBER NINETEEN, TWENTY, TWENTY-ONE, TWENTY-THREE, AND TWENTY-FOUR (N/A Mouth) INCISION AND DRAINAGE FACIAL ABSCESS (N/A Face)     Patient location during evaluation: PACU Anesthesia Type: General Level of consciousness: awake and alert Pain management: pain level controlled Vital Signs Assessment: post-procedure vital signs reviewed and stable Respiratory status: spontaneous breathing, nonlabored ventilation, respiratory function stable and patient connected to nasal cannula oxygen Cardiovascular status: blood pressure returned to baseline and stable Postop Assessment: no apparent nausea or vomiting Anesthetic complications: no   No complications documented.  Last Vitals:  Vitals:   08/29/20 0144 08/29/20 0817  BP: 115/60 136/75  Pulse: 63 78  Resp: 17   Temp: 36.7 C 36.9 C  SpO2: 97% 93%    Last Pain:  Vitals:   08/29/20 0817  TempSrc: Oral  PainSc: 0-No pain                 Mellody Dance

## 2020-08-29 NOTE — Plan of Care (Signed)

## 2020-08-29 NOTE — Progress Notes (Signed)
[facility] Progress Note  Patient: Kim Gordon    QPY:[id number]  DOA: 08/28/2020     Date of Service: the patient was seen and examined on 08/29/2020  Brief hospital course: Past medical history of HTN, hypothyroidism, obesity, anxiety.  Presents with complaints of toothache with left lower jaw swelling.  Underwent surgical extraction of teeth and IND of sublingual and submandibular abscess. Currently plan is continue IV antibiotics.  Assessment and Plan: 1.  Submandibular dental abscess SP teeth extraction and I&D Currently soft diet. Peridex. On IV ceftriaxone and Flagyl. Cultures are growing gram-positive cocci. Await sensitivity. Follow recommendation from dental surgery. Added pain control today.  2.  Sinus tachycardia. Likely pain reassessment Monitor.  3.  Hypothyroidism Continue home Synthroid  4.  Anxiety, insomnia, depression Currently stable.  Continue home regimen.  5.  History of diarrhea Currently resolved.  Monitor.  6.  Obesity Placing the patient at high risk for poor outcome. Body mass index is 35.51 kg/m.   Diet: Regular diet soft DVT Prophylaxis:    enoxaparin (LOVENOX) injection 40 mg Start: 08/29/20 1000 Place TED hose Start: 08/28/20 1819 SCD's Start: 08/28/20 1256    Advance goals of care discussion: Full code  Family Communication: nofamily was present at bedside, at the time of interview.   Disposition:  Status is: Inpatient  Remains inpatient appropriate because:IV treatments appropriate due to intensity of illness or inability to take PO   Dispo: The patient is from: Home              Anticipated d/c is to: Home              Anticipated d/c date is: 2 days              Patient currently is not medically stable to d/c.        Subjective: No nausea no vomiting.  Pain still uncontrolled.  No fever no chills.  Physical Exam:  General: Appear in mild distress, no Rash; Oral Mucosa Clear, moist. no Abnormal Neck  Mass Or lumps, Conjunctiva normal  Cardiovascular: S1 and S2 Present, no Murmur, Respiratory: good respiratory effort, Bilateral Air entry present and CTA, no Crackles, no wheezes Abdomen: Bowel Sound present, Soft and no tenderness Extremities: no Pedal edema Neurology: alert and oriented to time, place, and person affect appropriate. no new focal deficit Gait not checked due to patient safety concerns  Vitals:   08/28/20 1930 08/28/20 1959 08/29/20 0144 08/29/20 0817  BP: (!) 165/85 (!) 146/77 115/60 136/75  Pulse: 79 75 63 78  Resp: 15 17 17    Temp: 98.2 F (36.8 C) 98.7 F (37.1 C) 98 F (36.7 C) 98.5 F (36.9 C)  TempSrc:  Oral Oral Oral  SpO2: 95% 95% 97% 93%  Weight:      Height:        Intake/Output Summary (Last 24 hours) at 08/29/2020 1710 Last data filed at 08/29/2020 1500 Gross per 24 hour  Intake 1381.74 ml  Output --  Net 1381.74 ml   Filed Weights   08/28/20 1327  Weight: 99.8 kg    Data Reviewed: I have personally reviewed and interpreted daily labs, tele strips, imagings as discussed above. I reviewed all nursing notes, pharmacy notes, vitals, pertinent old records I have discussed plan of care as described above with RN and patient/family.  CBC: Recent Labs  Lab 08/28/20 1318 08/29/20 0153  WBC 15.0* 18.4*  NEUTROABS 11.7*  --   HGB 12.2 11.9*  HCT  38.8 37.4  MCV 92.6 91.7  PLT 444* 459*   Basic Metabolic Panel: Recent Labs  Lab 08/28/20 1318 08/29/20 0153  NA 137 138  K 3.0* 3.7  CL 100 101  CO2 25 27  GLUCOSE 115* 123*  BUN 6* 5*  CREATININE 0.82 0.84  CALCIUM 10.3 10.5*  MG  --  1.9    Studies: No results found.  Scheduled Meds: . ARIPiprazole  15 mg Oral Daily  . Chlorhexidine Gluconate Cloth  6 each Topical Once  . cholecalciferol  2,000 Units Oral Daily  . DULoxetine  60 mg Oral BID  . enoxaparin (LOVENOX) injection  40 mg Subcutaneous Q24H  . hydrochlorothiazide  25 mg Oral Daily  . levothyroxine  150 mcg Oral QAC  breakfast  . pantoprazole  40 mg Oral Daily  . potassium chloride  20 mEq Oral TID WC  . trazodone  300 mg Oral QHS  . verapamil  120 mg Oral Daily  . cyanocobalamin  1,000 mcg Oral Daily   Continuous Infusions: . cefTRIAXone (ROCEPHIN)  IV 2 g (08/29/20 0522)  . lactated ringers 10 mL/hr at 08/28/20 1409  . metronidazole 500 mg (08/29/20 1331)   PRN Meds: acetaminophen, albuterol, clonazePAM, HYDROcodone-acetaminophen, ketorolac, morphine injection, ondansetron **OR** ondansetron (ZOFRAN) IV  Time spent: 35 minutes  Author: Lynden Oxford, MD [facility] 08/29/2020 5:10 PM  To reach On-call, see care teams to locate the attending and reach out via [email or web address]. Between 7PM-7AM, please contact night-coverage If you still have difficulty reaching the attending provider, please page the [facility] (Director on Call) for [facility] on amion for assistance.

## 2020-08-29 NOTE — Progress Notes (Signed)
Kim Gordon is an 64 y.o. female with left submandibular/sublingual space odontogenic infection.  ID: Patient referred  for evaluation of swelling of the left face/submandibular area  HPI: The patient reports pain and swelling that began weeks ago on the left side of her face and has progressively gotten worse.  The patient has taken a course of Clindamycin without much benefit. The patient reports trismus and dysphagia, denies fever or dyspnea.  Interval History 08/29/20 - POD #1. Patient reports feeling about the same overall. Continues to endorse dysphagia, able to swallow and breathe unobstructed. Reports continuous drainage from penrose. On soft diet. WBC bump from 15 to 18.  On exam, swelling slightly less firm in left submandibular/submental area. SS drainage from penrose. MIO 35 mm. Oropharynx clear w/o palatal draping. Intraorally, left FOM and vestibule remain raised/erythematous.   Past Medical History:  Diagnosis Date  . Anemia   . Anxiety   . Arthritis   . Carpal tunnel syndrome   . Depression   . Fibromyalgia   . GERD (gastroesophageal reflux disease)   . Hypertension   . Hypothyroidism   . Sjogren's syndrome (HCC)   . Sleep apnea     Past Surgical History:  Procedure Laterality Date  . APPENDECTOMY    . CHOLECYSTECTOMY    . COLONOSCOPY    . DENTAL SURGERY  08/28/2020  . INCISION AND DRAINAGE ABSCESS N/A 08/28/2020   Procedure: INCISION AND DRAINAGE FACIAL ABSCESS;  Surgeon: Enis Slipper, DMD;  Location: [facility] OR;  Service: Dentistry;  Laterality: N/A;  . TOOTH EXTRACTION N/A 08/28/2020   Procedure: DENTAL EXTRACTION OF TEETH NUMBER NINETEEN, TWENTY, TWENTY-ONE, TWENTY-THREE, AND TWENTY-FOUR;  Surgeon: Enis Slipper, DMD;  Location: [facility] OR;  Service: Dentistry;  Laterality: N/A;  . TUBAL LIGATION    . WRIST FRACTURE SURGERY Left     History reviewed. No pertinent family history. Social History:  reports that she has never smoked. She has never used  smokeless tobacco. She reports that she does not drink alcohol and does not use drugs.  Allergies:  Allergies  Allergen Reactions  . Drug Ingredient [Sulfamethoxazole-Trimethoprim] Hives  . Penicillins Hives    Medications Prior to Admission  Medication Sig Dispense Refill  . ARIPiprazole (ABILIFY) 15 MG tablet Take 15 mg by mouth daily.    . benztropine (COGENTIN) 0.5 MG tablet Take 0.5 mg by mouth in the morning and at bedtime.    . Cholecalciferol 50 MCG (2000 UT) CAPS Take 2,000 Units by mouth daily.    . clonazePAM (KLONOPIN) 0.5 MG tablet Take 0.5 mg by mouth 3 (three) times daily as needed for anxiety.    . cyanocobalamin 1000 MCG tablet Take 1,000 mcg by mouth daily.    . DULoxetine (CYMBALTA) 60 MG capsule Take 60 mg by mouth 2 (two) times daily.    Marland Kitchen esomeprazole (NEXIUM) 40 MG capsule Take 40 mg by mouth daily.    Marland Kitchen HYDROcodone-acetaminophen (NORCO) 7.5-325 MG tablet Take 1 tablet by mouth every 6 (six) hours as needed for pain.    Marland Kitchen levothyroxine (SYNTHROID) 150 MCG tablet Take 150 mcg by mouth daily.    . trazodone (DESYREL) 300 MG tablet Take 300 mg by mouth at bedtime.    . verapamil (CALAN-SR) 120 MG CR tablet Take 120 mg by mouth daily.    Marland Kitchen albuterol (VENTOLIN HFA) 108 (90 Base) MCG/ACT inhaler Inhale 2 puffs into the lungs every 6 (six) hours as needed for wheezing or shortness of breath.    Marland Kitchen  hydrochlorothiazide (HYDRODIURIL) 25 MG tablet Take 25 mg by mouth daily.      Results for orders placed or performed during the hospital encounter of 08/28/20 (from the past 48 hour(s))  SARS Coronavirus 2 by RT PCR (hospital order, performed in [facility] hospital lab) Nasopharyngeal Nasopharyngeal Swab     Status: None   Collection Time: 08/28/20  1:11 PM   Specimen: Nasopharyngeal Swab  Result Value Ref Range   SARS Coronavirus 2 NEGATIVE NEGATIVE    Comment: (NOTE) SARS-CoV-2 target nucleic acids are NOT DETECTED.  The SARS-CoV-2 RNA is generally detectable in upper  and lower respiratory specimens during the acute phase of infection. The lowest concentration of SARS-CoV-2 viral copies this assay can detect is 250 copies / mL. A negative result does not preclude SARS-CoV-2 infection and should not be used as the sole basis for treatment or other patient management decisions.  A negative result may occur with improper specimen collection / handling, submission of specimen other than nasopharyngeal swab, presence of viral mutation(s) within the areas targeted by this assay, and inadequate number of viral copies (<250 copies / mL). A negative result must be combined with clinical observations, patient history, and epidemiological information.  Fact Sheet for Patients:   BoilerBrush.com.cyhttps://www.fda.gov/media/136312/download  Fact Sheet for Healthcare Providers: https://pope.com/https://www.fda.gov/media/136313/download  This test is not yet approved or  cleared by the [place] FDA and has been authorized for detection and/or diagnosis of SARS-CoV-2 by FDA under an Emergency Use Authorization (EUA).  This EUA will remain in effect (meaning this test can be used) for the duration of the COVID-19 declaration under Section 564(b)(1) of the Act, 21 U.S.C. section 360bbb-3(b)(1), unless the authorization is terminated or revoked sooner.  Performed at [facility], [address] [place], [place] [address]   Basic metabolic panel     Status: Abnormal   Collection Time: 08/28/20  1:18 PM  Result Value Ref Range   Sodium 137 135 - 145 mmol/L   Potassium 3.0 (L) 3.5 - 5.1 mmol/L   Chloride 100 98 - 111 mmol/L   CO2 25 22 - 32 mmol/L   Glucose, Bld 115 (H) 70 - 99 mg/dL    Comment: Glucose reference range applies only to samples taken after fasting for at least 8 hours.   BUN 6 (L) 8 - 23 mg/dL   Creatinine, Ser 7.840.82 0.44 - 1.00 mg/dL   Calcium 69.610.3 8.9 - 29.510.3 mg/dL   GFR, Estimated >28>60 >41>60 mL/min    Comment: (NOTE) Calculated using the CKD-EPI Creatinine Equation  (2021)    Anion gap 12 5 - 15    Comment: Performed at [facility], [address] [place], [place] [address]  CBC WITH DIFFERENTIAL     Status: Abnormal   Collection Time: 08/28/20  1:18 PM  Result Value Ref Range   WBC 15.0 (H) 4.0 - 10.5 K/uL   RBC 4.19 3.87 - 5.11 MIL/uL   Hemoglobin 12.2 12.0 - 15.0 g/dL   HCT 10.238.8 36 - 46 %   MCV 92.6 80.0 - 100.0 fL   MCH 29.1 26.0 - 34.0 pg   MCHC 31.4 30.0 - 36.0 g/dL   RDW 72.514.0 36.611.5 - 44.015.5 %   Platelets 444 (H) 150 - 400 K/uL   nRBC 0.0 0.0 - 0.2 %   Neutrophils Relative % 78 %   Neutro Abs 11.7 (H) 1.7 - 7.7 K/uL   Lymphocytes Relative 13 %   Lymphs Abs 1.9 0.7 -  4.0 K/uL   Monocytes Relative 8 %   Monocytes Absolute 1.2 (H) 0.1 - 1.0 K/uL   Eosinophils Relative 0 %   Eosinophils Absolute 0.0 0.0 - 0.5 K/uL   Basophils Relative 0 %   Basophils Absolute 0.1 0.0 - 0.1 K/uL   Immature Granulocytes 1 %   Abs Immature Granulocytes 0.10 (H) 0.00 - 0.07 K/uL    Comment: Performed at [facility], [address], [place], [place] [address]  Aerobic/Anaerobic Culture (surgical/deep wound)     Status: None (Preliminary result)   Collection Time: 08/28/20  4:45 PM   Specimen: Abscess  Result Value Ref Range   Specimen Description ABSCESS LEFT MOUTH    Special Requests PATIENT ON FOLLOWING ROCEPHIN FLAGYL    Gram Stain      MODERATE WBC PRESENT, PREDOMINANTLY PMN FEW SQUAMOUS EPITHELIAL CELLS PRESENT MODERATE GRAM POSITIVE COCCI IN PAIRS    Culture      CULTURE REINCUBATED FOR BETTER GROWTH Performed at [facility], [address], [place], [place] [address]    Report Status PENDING   HIV Antibody (routine testing w rflx)     Status: None   Collection Time: 08/29/20  1:53 AM  Result Value Ref Range   HIV Screen 4th Generation wRfx Non Reactive Non Reactive    Comment: Performed at [facility], [address] [place], [place] [address]  Comprehensive metabolic panel     Status: Abnormal   Collection Time:  08/29/20  1:53 AM  Result Value Ref Range   Sodium 138 135 - 145 mmol/L   Potassium 3.7 3.5 - 5.1 mmol/L    Comment: NO VISIBLE HEMOLYSIS   Chloride 101 98 - 111 mmol/L   CO2 27 22 - 32 mmol/L   Glucose, Bld 123 (H) 70 - 99 mg/dL    Comment: Glucose reference range applies only to samples taken after fasting for at least 8 hours.   BUN 5 (L) 8 - 23 mg/dL   Creatinine, Ser 3.66 0.44 - 1.00 mg/dL   Calcium 44.0 (H) 8.9 - 10.3 mg/dL   Total Protein 7.5 6.5 - 8.1 g/dL   Albumin 3.0 (L) 3.5 - 5.0 g/dL   AST 16 15 - 41 U/L   ALT 11 0 - 44 U/L   Alkaline Phosphatase 94 38 - 126 U/L   Total Bilirubin 0.3 0.3 - 1.2 mg/dL   GFR, Estimated >34 >74 mL/min    Comment: (NOTE) Calculated using the CKD-EPI Creatinine Equation (2021)    Anion gap 10 5 - 15    Comment: Performed at [facility], [address] [place], [place] [address]  CBC     Status: Abnormal   Collection Time: 08/29/20  1:53 AM  Result Value Ref Range   WBC 18.4 (H) 4.0 - 10.5 K/uL   RBC 4.08 3.87 - 5.11 MIL/uL   Hemoglobin 11.9 (L) 12.0 - 15.0 g/dL   HCT 38.7 36 - 46 %   MCV 91.7 80.0 - 100.0 fL   MCH 29.2 26.0 - 34.0 pg   MCHC 31.8 30.0 - 36.0 g/dL   RDW 56.4 33.2 - 95.1 %   Platelets 459 (H) 150 - 400 K/uL   nRBC 0.0 0.0 - 0.2 %    Comment: Performed at [facility], [address] [place], [place] [address]  TSH     Status: Abnormal   Collection Time: 08/29/20  1:53 AM  Result Value Ref  Range   TSH 5.698 (H) 0.350 - 4.500 uIU/mL    Comment: Performed by a 3rd Generation assay with a functional sensitivity of <=0.01 uIU/mL. Performed at [facility], [address] [place], [place] [address]   Magnesium     Status: None   Collection Time: 08/29/20  1:53 AM  Result Value Ref Range   Magnesium 1.9 1.7 - 2.4 mg/dL    Comment: Performed at [facility], [address], [place], [place] [address]   DG Orthopantogram  Result Date: 08/28/2020 CLINICAL DATA:  Infection.  Abscesses. EXAM:  ORTHOPANTOGRAM/PANORAMIC COMPARISON:  None. FINDINGS: Maxillary dentition: 1 and 2 absent. Previous root canal tooth 4 with advanced decay. Advanced decay of tooth 6. Missing teeth 13 through 16. Mandibular dentition: Crown tooth 17. Previous root canals at 18 and 19 with advanced decay. Advanced decay of tooth 20 with periapical lucency. Advanced decay teeth 23 and 24. Advanced decay tooth 25. Advanced decay tooth 28 with periapical lucency. Teeth 29 through 32 absent. IMPRESSION: Advanced dental and periodontal disease as above. Electronically Signed   By: Paulina Fusi M.D.   On: 08/28/2020 14:16   CT SOFT TISSUE NECK W CONTRAST  Result Date: 08/28/2020 CLINICAL DATA:  Left neck abscess EXAM: CT NECK WITH CONTRAST TECHNIQUE: Multidetector CT imaging of the neck was performed using the standard protocol following the bolus administration of intravenous contrast. CONTRAST:  1mL OMNIPAQUE IOHEXOL 300 MG/ML  SOLN COMPARISON:  None. FINDINGS: Pharynx and larynx: Inflammatory process detailed below is contiguous with the lateral left oropharyngeal wall. Otherwise unremarkable. Airway is patent. Salivary glands: Normal left submandibular gland is not seen. There is ill-defined soft tissue enhancement within the submandibular space also involving the sublingual space. Infiltration of surrounding fat. There is a 2 x 1 x 1.1 cm peripherally enhancing left sublingual collection. More posterior superiorly there may be another developing collection seen on series 3, image 72. Left mandibular tooth decay is present without periapical lucency or dehiscence. Both parotid and right submandibular glands are unremarkable apart from fatty replacement. Thyroid: Normal. Lymph nodes: Nonenlarged but increased number of cervical lymph nodes are likely reactive. Vascular: Major neck vessels are patent. Limited intracranial: No abnormal enhancement. Visualized orbits: Unremarkable. Mastoids and visualized paranasal sinuses: No  significant opacification. Skeleton: Degenerative changes of the cervical spine. Degenerative changes at the right temporomandibular joint. Upper chest: No apical lung mass. Other: None. IMPRESSION: Left submandibular and sublingual phlegmon with 2 cm left sublingual abscess. Possible additional developing abscess more posterosuperiorly. Electronically Signed   By: Guadlupe Spanish M.D.   On: 08/28/2020 15:38    Review of Systems  HENT: Positive for facial swelling and trouble swallowing. Negative for mouth sores.   All other systems reviewed and are negative.   Blood pressure 136/75, pulse 78, temperature 98.5 F (36.9 C), temperature source Oral, resp. rate 17, height 5\' 6"  (1.676 m), weight 99.8 kg, SpO2 93 %. Physical Exam Constitutional:      General: She is awake.     Appearance: Normal appearance.  HENT:     Head: Normocephalic.     Jaw: Trismus, tenderness, swelling and pain on movement present.     Salivary Glands: Left salivary gland is tender.     Mouth/Throat:     Dentition: Dental tenderness, dental caries and dental abscesses present.     Palate: No mass.     Pharynx: Oropharynx is clear. No pharyngeal swelling.  Cardiovascular:     Rate and Rhythm:  Normal rate and regular rhythm.  Pulmonary:     Effort: Pulmonary effort is normal.     Breath sounds: Normal breath sounds.  Musculoskeletal:     Cervical back: Normal range of motion. Edema and erythema present.  Neurological:     Mental Status: She is alert.  Psychiatric:        Behavior: Behavior is cooperative.      Assessment/Plan 89 yoF with left submandibular, sublingual space odontogenic infection with resulting trismus and dysphagia with multiple carious, non-restorable teeth s/p transcutaneous I&D and extraction of teeth #19-21, 23-24 on 08/28/20.   Overall patient is stable, submandibular cellulitis is slightly softer today. Will continue to monitor patient.  OMFS Recommendation -Daily CBCs w/ diff, will  follow cultures -Continue IV ceftriaxone + flagyl -Peridex mouthrinse QID -Continue soft chew diet -Change Kerlix dressing when saturated prn -Will leave drains in place until discharge, keep inpatient until patient's clinical course improves   Enis Slipper, DMD 08/29/2020, 6:40 PM

## 2020-08-30 DIAGNOSIS — M272 Inflammatory conditions of jaws: Secondary | ICD-10-CM | POA: Diagnosis not present

## 2020-08-30 LAB — CBC WITH DIFFERENTIAL/PLATELET
Abs Immature Granulocytes: 0.07 10*3/uL (ref 0.00–0.07)
Basophils Absolute: 0 10*3/uL (ref 0.0–0.1)
Basophils Relative: 0 %
Eosinophils Absolute: 0 10*3/uL (ref 0.0–0.5)
Eosinophils Relative: 0 %
HCT: 34.2 % — ABNORMAL LOW (ref 36.0–46.0)
Hemoglobin: 10.7 g/dL — ABNORMAL LOW (ref 12.0–15.0)
Immature Granulocytes: 1 %
Lymphocytes Relative: 20 %
Lymphs Abs: 3 10*3/uL (ref 0.7–4.0)
MCH: 29.2 pg (ref 26.0–34.0)
MCHC: 31.3 g/dL (ref 30.0–36.0)
MCV: 93.2 fL (ref 80.0–100.0)
Monocytes Absolute: 1.4 10*3/uL — ABNORMAL HIGH (ref 0.1–1.0)
Monocytes Relative: 9 %
Neutro Abs: 10.3 10*3/uL — ABNORMAL HIGH (ref 1.7–7.7)
Neutrophils Relative %: 70 %
Platelets: 410 10*3/uL — ABNORMAL HIGH (ref 150–400)
RBC: 3.67 MIL/uL — ABNORMAL LOW (ref 3.87–5.11)
RDW: 14.2 % (ref 11.5–15.5)
WBC: 14.7 10*3/uL — ABNORMAL HIGH (ref 4.0–10.5)
nRBC: 0 % (ref 0.0–0.2)

## 2020-08-30 LAB — BASIC METABOLIC PANEL
Anion gap: 7 (ref 5–15)
BUN: 5 mg/dL — ABNORMAL LOW (ref 8–23)
CO2: 29 mmol/L (ref 22–32)
Calcium: 10.1 mg/dL (ref 8.9–10.3)
Chloride: 103 mmol/L (ref 98–111)
Creatinine, Ser: 0.85 mg/dL (ref 0.44–1.00)
GFR, Estimated: >60 mL/min (ref >60)
Glucose, Bld: 98 mg/dL (ref 70–99)
Potassium: 3.7 mmol/L (ref 3.5–5.1)
Sodium: 139 mmol/L (ref 135–145)

## 2020-08-30 MED ORDER — CHLORHEXIDINE GLUCONATE 0.12 % MT SOLN
15.0000 mL | Freq: Four times a day (QID) | OROMUCOSAL | Status: DC
  Administered 2020-08-30 – 2020-09-01 (×9): 15 mL via OROMUCOSAL
  Filled 2020-08-30 (×9): qty 15

## 2020-08-30 MED ORDER — METRONIDAZOLE 500 MG PO TABS
500.0000 mg | ORAL_TABLET | Freq: Three times a day (TID) | ORAL | Status: DC
  Administered 2020-08-30 – 2020-09-01 (×7): 500 mg via ORAL
  Filled 2020-08-30 (×7): qty 1

## 2020-08-30 MED ORDER — KETOROLAC TROMETHAMINE 30 MG/ML IJ SOLN
30.0000 mg | Freq: Four times a day (QID) | INTRAMUSCULAR | Status: DC
  Administered 2020-08-30 – 2020-08-31 (×5): 30 mg via INTRAVENOUS
  Filled 2020-08-30 (×5): qty 1

## 2020-08-30 NOTE — Plan of Care (Signed)

## 2020-08-30 NOTE — Plan of Care (Signed)
Dressing to neck changed, moderate yellow drainage noted. Penrose drains intact.

## 2020-08-30 NOTE — Progress Notes (Signed)
Kim Gordon is an 64 y.o. female with left submandibular/sublingual space odontogenic infection.  ID: Patient referred  for evaluation of swelling of the left face/submandibular area  HPI: The patient reports pain and swelling that began weeks ago on the left side of her face and has progressively gotten worse.  The patient has taken a course of Clindamycin without much benefit. The patient reports trismus and dysphagia, denies fever or dyspnea.  Interval History 08/29/20 - POD #1. Patient reports feeling about the same overall. Continues to endorse dysphagia, able to swallow and breathe unobstructed. Reports continuous drainage from penrose. On soft diet. WBC bump from 15 to 18.  On exam, swelling slightly less firm in left submandibular/submental area. SS drainage from penrose. MIO 35 mm. Oropharynx clear w/o palatal draping. Intraorally, left FOM and vestibule remain raised/erythematous.  08/30/20 - POD#2. Patient reports feeling maybe "slightly better." Reports improvement in mouth opening and dysphagia however she believes the swelling in the FOM and submental areas are a little worse. Reports decreased drainage from penrose but has foul taste in her mouth. WBC down to 14.7 from 18. On exam she has increased erythema/edema in both the submental and left FOM. Intraorally there is increased purulence coming from the FOM. MIO 40 mm.    Past Medical History:  Diagnosis Date  . Anemia   . Anxiety   . Arthritis   . Carpal tunnel syndrome   . Depression   . Fibromyalgia   . GERD (gastroesophageal reflux disease)   . Hypertension   . Hypothyroidism   . Sjogren's syndrome (HCC)   . Sleep apnea     Past Surgical History:  Procedure Laterality Date  . APPENDECTOMY    . CHOLECYSTECTOMY    . COLONOSCOPY    . DENTAL SURGERY  08/28/2020  . INCISION AND DRAINAGE ABSCESS N/A 08/28/2020   Procedure: INCISION AND DRAINAGE FACIAL ABSCESS;  Surgeon: Enis SlipperSherwood, Aleric Froelich J, DMD;  Location: [facility] OR;   Service: Dentistry;  Laterality: N/A;  . TOOTH EXTRACTION N/A 08/28/2020   Procedure: DENTAL EXTRACTION OF TEETH NUMBER NINETEEN, TWENTY, TWENTY-ONE, TWENTY-THREE, AND TWENTY-FOUR;  Surgeon: Enis SlipperSherwood, Dock Baccam J, DMD;  Location: [facility] OR;  Service: Dentistry;  Laterality: N/A;  . TUBAL LIGATION    . WRIST FRACTURE SURGERY Left     History reviewed. No pertinent family history. Social History:  reports that she has never smoked. She has never used smokeless tobacco. She reports that she does not drink alcohol and does not use drugs.  Allergies:  Allergies  Allergen Reactions  . Drug Ingredient [Sulfamethoxazole-Trimethoprim] Hives  . Penicillins Hives    Medications Prior to Admission  Medication Sig Dispense Refill  . ARIPiprazole (ABILIFY) 15 MG tablet Take 15 mg by mouth daily.    . benztropine (COGENTIN) 0.5 MG tablet Take 0.5 mg by mouth in the morning and at bedtime.    . Cholecalciferol 50 MCG (2000 UT) CAPS Take 2,000 Units by mouth daily.    . clonazePAM (KLONOPIN) 0.5 MG tablet Take 0.5 mg by mouth 3 (three) times daily as needed for anxiety.    . cyanocobalamin 1000 MCG tablet Take 1,000 mcg by mouth daily.    . DULoxetine (CYMBALTA) 60 MG capsule Take 60 mg by mouth 2 (two) times daily.    Marland Kitchen. esomeprazole (NEXIUM) 40 MG capsule Take 40 mg by mouth daily.    Marland Kitchen. HYDROcodone-acetaminophen (NORCO) 7.5-325 MG tablet Take 1 tablet by mouth every 6 (six) hours as needed for pain.    .Marland Kitchen  levothyroxine (SYNTHROID) 150 MCG tablet Take 150 mcg by mouth daily.    . trazodone (DESYREL) 300 MG tablet Take 300 mg by mouth at bedtime.    . verapamil (CALAN-SR) 120 MG CR tablet Take 120 mg by mouth daily.    Marland Kitchen albuterol (VENTOLIN HFA) 108 (90 Base) MCG/ACT inhaler Inhale 2 puffs into the lungs every 6 (six) hours as needed for wheezing or shortness of breath.    . hydrochlorothiazide (HYDRODIURIL) 25 MG tablet Take 25 mg by mouth daily.      Results for orders placed or performed during the  hospital encounter of 08/28/20 (from the past 48 hour(s))  HIV Antibody (routine testing w rflx)     Status: None   Collection Time: 08/29/20  1:53 AM  Result Value Ref Range   HIV Screen 4th Generation wRfx Non Reactive Non Reactive    Comment: Performed at [facility], [address], [place], [place] [address]  Comprehensive metabolic panel     Status: Abnormal   Collection Time: 08/29/20  1:53 AM  Result Value Ref Range   Sodium 138 135 - 145 mmol/L   Potassium 3.7 3.5 - 5.1 mmol/L    Comment: NO VISIBLE HEMOLYSIS   Chloride 101 98 - 111 mmol/L   CO2 27 22 - 32 mmol/L   Glucose, Bld 123 (H) 70 - 99 mg/dL    Comment: Glucose reference range applies only to samples taken after fasting for at least 8 hours.   BUN 5 (L) 8 - 23 mg/dL   Creatinine, Ser 2.20 0.44 - 1.00 mg/dL   Calcium 25.4 (H) 8.9 - 10.3 mg/dL   Total Protein 7.5 6.5 - 8.1 g/dL   Albumin 3.0 (L) 3.5 - 5.0 g/dL   AST 16 15 - 41 U/L   ALT 11 0 - 44 U/L   Alkaline Phosphatase 94 38 - 126 U/L   Total Bilirubin 0.3 0.3 - 1.2 mg/dL   GFR, Estimated >27 >06 mL/min    Comment: (NOTE) Calculated using the CKD-EPI Creatinine Equation (2021)    Anion gap 10 5 - 15    Comment: Performed at [facility], [address], [place], [place] [address]  CBC     Status: Abnormal   Collection Time: 08/29/20  1:53 AM  Result Value Ref Range   WBC 18.4 (H) 4.0 - 10.5 K/uL   RBC 4.08 3.87 - 5.11 MIL/uL   Hemoglobin 11.9 (L) 12.0 - 15.0 g/dL   HCT 83.1 36 - 46 %   MCV 91.7 80.0 - 100.0 fL   MCH 29.2 26.0 - 34.0 pg   MCHC 31.8 30.0 - 36.0 g/dL   RDW 51.7 61.6 - 07.3 %   Platelets 459 (H) 150 - 400 K/uL   nRBC 0.0 0.0 - 0.2 %    Comment: Performed at [facility], [address], [place], [place] [address]  TSH     Status: Abnormal   Collection Time: 08/29/20  1:53 AM  Result Value Ref Range   TSH 5.698 (H) 0.350 - 4.500 uIU/mL    Comment: Performed by a 3rd Generation assay with a functional sensitivity of  <=0.01 uIU/mL. Performed at [facility], [address], [place], [place] [address]   Magnesium     Status: None   Collection Time: 08/29/20  1:53 AM  Result Value Ref Range   Magnesium 1.9 1.7 - 2.4 mg/dL    Comment: Performed at [facility], [address], [place], [place] [address]  CBC with Differential/Platelet     Status: Abnormal   Collection Time: 08/30/20  1:36 AM  Result Value Ref Range   WBC 14.7 (H) 4.0 - 10.5 K/uL   RBC 3.67 (L) 3.87 - 5.11 MIL/uL   Hemoglobin 10.7 (L) 12.0 - 15.0 g/dL   HCT 93.2 (L) 36 - 46 %   MCV 93.2 80.0 - 100.0 fL   MCH 29.2 26.0 - 34.0 pg   MCHC 31.3 30.0 - 36.0 g/dL   RDW 35.5 73.2 - 20.2 %   Platelets 410 (H) 150 - 400 K/uL   nRBC 0.0 0.0 - 0.2 %   Neutrophils Relative % 70 %   Neutro Abs 10.3 (H) 1.7 - 7.7 K/uL   Lymphocytes Relative 20 %   Lymphs Abs 3.0 0.7 - 4.0 K/uL   Monocytes Relative 9 %   Monocytes Absolute 1.4 (H) 0.1 - 1.0 K/uL   Eosinophils Relative 0 %   Eosinophils Absolute 0.0 0.0 - 0.5 K/uL   Basophils Relative 0 %   Basophils Absolute 0.0 0.0 - 0.1 K/uL   Immature Granulocytes 1 %   Abs Immature Granulocytes 0.07 0.00 - 0.07 K/uL    Comment: Performed at [facility], [address], [place], [place] [address]  Basic metabolic panel     Status: Abnormal   Collection Time: 08/30/20  1:36 AM  Result Value Ref Range   Sodium 139 135 - 145 mmol/L   Potassium 3.7 3.5 - 5.1 mmol/L   Chloride 103 98 - 111 mmol/L   CO2 29 22 - 32 mmol/L   Glucose, Bld 98 70 - 99 mg/dL    Comment: Glucose reference range applies only to samples taken after fasting for at least 8 hours.   BUN 5 (L) 8 - 23 mg/dL   Creatinine, Ser 6.23 0.44 - 1.00 mg/dL   Calcium 76.2 8.9 - 83.1 mg/dL   GFR, Estimated >51 >76 mL/min    Comment: (NOTE) Calculated using the CKD-EPI Creatinine Equation (2021)    Anion gap 7 5 - 15    Comment: Performed at [facility], [address], [place], [place] [address]   No results  found.  Review of Systems  HENT: Positive for facial swelling and trouble swallowing. Negative for mouth sores.   All other systems reviewed and are negative.   Blood pressure 132/71, pulse 72, temperature 98.5 F (36.9 C), temperature source Oral, resp. rate 16, height 5\' 6"  (1.676 m), weight 99.8 kg, SpO2 96 %. Physical Exam Constitutional:      General: She is awake.     Appearance: Normal appearance.  HENT:     Head: Normocephalic.     Jaw: Trismus, tenderness, swelling and pain on movement present.     Salivary Glands: Left salivary gland is tender.     Mouth/Throat:     Dentition: Dental tenderness, dental caries and dental abscesses present.     Palate: No mass.     Pharynx: Oropharynx is clear. No pharyngeal swelling.  Cardiovascular:     Rate and Rhythm: Normal rate and regular rhythm.  Pulmonary:     Effort: Pulmonary effort is normal.     Breath sounds: Normal breath sounds.  Musculoskeletal:     Cervical back: Normal range of motion. Edema and erythema present.  Neurological:     Mental Status: She is alert.  Psychiatric:  Behavior: Behavior is cooperative.      Assessment/Plan 51 yoF with left submandibular, sublingual space odontogenic infection with resulting trismus and dysphagia with multiple carious, non-restorable teeth s/p transcutaneous I&D and extraction of teeth #19-21, 23-24 on 08/28/20.   Overall patient is stable, submandibular cellulitis is slightly improved however clinically she has increased erythema/edema in the submental and sublingual areas.  Will continue to monitor patient.  OMFS Recommendation -Make NPO at midnight tonight, patient may need to return to the OR for repeat I&D -Plan for CT scan larynx w/ contrast tomorrow morning  -Daily CBCs w/ diff, will follow cultures -Continue IV ceftriaxone + flagyl -Peridex mouthrinse QID -Continue soft chew diet -Change Kerlix dressing when saturated prn -Will leave drains in place until  discharge, keep inpatient until patient's clinical course improves   Enis Slipper, DMD 08/30/2020, 4:52 PM

## 2020-08-30 NOTE — Plan of Care (Signed)
Patient is alert and oriented x4. Ambulates to the bathroom, has surgical pain rated 8/10. Prn morphine with relief. On IV antibiotics. Problem: Education: Goal: Knowledge of General Education information will improve Description: Including pain rating scale, medication(s)/side effects and non-pharmacologic comfort measures Outcome: Progressing   Problem: Health Behavior/Discharge Planning: Goal: Ability to manage health-related needs will improve Outcome: Progressing   Problem: Clinical Measurements: Goal: Ability to maintain clinical measurements within normal limits will improve Outcome: Progressing Goal: Will remain free from infection Outcome: Progressing Goal: Diagnostic test results will improve Outcome: Progressing Goal: Respiratory complications will improve Outcome: Progressing Goal: Cardiovascular complication will be avoided Outcome: Progressing   Problem: Activity: Goal: Risk for activity intolerance will decrease Outcome: Progressing   Problem: Nutrition: Goal: Adequate nutrition will be maintained Outcome: Progressing   Problem: Coping: Goal: Level of anxiety will decrease Outcome: Progressing   Problem: Elimination: Goal: Will not experience complications related to bowel motility Outcome: Progressing Goal: Will not experience complications related to urinary retention Outcome: Progressing   Problem: Pain Managment: Goal: General experience of comfort will improve Outcome: Progressing   Problem: Safety: Goal: Ability to remain free from injury will improve Outcome: Progressing   Problem: Skin Integrity: Goal: Risk for impaired skin integrity will decrease Outcome: Progressing

## 2020-08-30 NOTE — Progress Notes (Signed)
[facility] Progress Note  Patient: Kim Gordon    BWI:[id number]  DOA: 08/28/2020     Date of Service: the patient was seen and examined on 08/30/2020  Brief hospital course: Past medical history of HTN, hypothyroidism, obesity, anxiety.  Presents with complaints of toothache with left lower jaw swelling.  Underwent surgical extraction of teeth and IND of sublingual and submandibular abscess. Currently plan is continue IV antibiotics.  Assessment and Plan: 1.  Submandibular dental abscess SP teeth extraction and I&D Currently soft diet. Peridex. On IV ceftriaxone and Flagyl. We will switch to oral Flagyl. Unable to tolerate penicillin and recently failed clindamycin just prior to hospitalization Cultures are growing gram-positive cocci. Await sensitivity. Follow recommendation from dental surgery. Continue pain control. Oral surgery planning for repeat I&D tomorrow.  N.p.o. after midnight.  2.  Sinus tachycardia. Likely pain reassessment Monitor.  3.  Hypothyroidism Continue home Synthroid  4.  Anxiety, insomnia, depression Currently stable.  Continue home regimen.  5.  History of diarrhea Currently resolved.  Monitor.  6.  Obesity Placing the patient at high risk for poor outcome. Body mass index is 35.51 kg/m.   Diet: Regular diet soft DVT Prophylaxis:    enoxaparin (LOVENOX) injection 40 mg Start: 08/29/20 1000 Place TED hose Start: 08/28/20 1819 SCD's Start: 08/28/20 1256    Advance goals of care discussion: Full code  Family Communication: nofamily was present at bedside, at the time of interview.   Disposition:  Status is: Inpatient  Remains inpatient appropriate because:IV treatments appropriate due to intensity of illness or inability to take PO   Dispo: The patient is from: Home              Anticipated d/c is to: Home              Anticipated d/c date is: 2 days              Patient currently is not medically stable to  d/c.  Subjective: Pain still about the same.  No nausea no vomiting.  No fever no chills.  Diarrhea resolved  Physical Exam:  General: Appear in mild distress, no Rash; Oral Mucosa Clear, moist. no Abnormal Neck Mass Or lumps, Conjunctiva normal  Cardiovascular: S1 and S2 Present, no Murmur, Respiratory: good respiratory effort, Bilateral Air entry present and CTA, no Crackles, no wheezes Abdomen: Bowel Sound present, Soft and no tenderness Extremities: no Pedal edema Neurology: alert and oriented to time, place, and person affect appropriate. no new focal deficit Gait not checked due to patient safety concerns  Vitals:   08/29/20 0817 08/29/20 2052 08/30/20 0353 08/30/20 0822  BP: 136/75 (!) 146/72 134/80 132/71  Pulse: 78 72 75 72  Resp:  16 16 16   Temp: 98.5 F (36.9 C) 98 F (36.7 C) 98.6 F (37 C) 98.5 F (36.9 C)  TempSrc: Oral Oral Oral Oral  SpO2: 93% 94% 94% 96%  Weight:      Height:        Intake/Output Summary (Last 24 hours) at 08/30/2020 1854 Last data filed at 08/30/2020 13/01/2020 Gross per 24 hour  Intake --  Output 450 ml  Net -450 ml   Filed Weights   08/28/20 1327  Weight: 99.8 kg    Data Reviewed: I have personally reviewed and interpreted daily labs, tele strips, imagings as discussed above. I reviewed all nursing notes, pharmacy notes, vitals, pertinent old records I have discussed plan of care as described above with RN and patient/family.  CBC: Recent Labs  Lab 08/28/20 1318 08/29/20 0153 08/30/20 0136  WBC 15.0* 18.4* 14.7*  NEUTROABS 11.7*  --  10.3*  HGB 12.2 11.9* 10.7*  HCT 38.8 37.4 34.2*  MCV 92.6 91.7 93.2  PLT 444* 459* 410*   Basic Metabolic Panel: Recent Labs  Lab 08/28/20 1318 08/29/20 0153 08/30/20 0136  NA 137 138 139  K 3.0* 3.7 3.7  CL 100 101 103  CO2 25 27 29   GLUCOSE 115* 123* 98  BUN 6* 5* 5*  CREATININE 0.82 0.84 0.85  CALCIUM 10.3 10.5* 10.1  MG  --  1.9  --     Studies: No results found.  Scheduled  Meds: . ARIPiprazole  15 mg Oral Daily  . chlorhexidine  15 mL Mouth/Throat QID  . Chlorhexidine Gluconate Cloth  6 each Topical Once  . cholecalciferol  2,000 Units Oral Daily  . DULoxetine  60 mg Oral BID  . enoxaparin (LOVENOX) injection  40 mg Subcutaneous Q24H  . hydrochlorothiazide  25 mg Oral Daily  . ketorolac  30 mg Intravenous Q6H  . levothyroxine  150 mcg Oral QAC breakfast  . metroNIDAZOLE  500 mg Oral Q8H  . pantoprazole  40 mg Oral Daily  . trazodone  300 mg Oral QHS  . verapamil  120 mg Oral Daily  . cyanocobalamin  1,000 mcg Oral Daily   Continuous Infusions: . cefTRIAXone (ROCEPHIN)  IV 2 g (08/30/20 0437)  . lactated ringers 10 mL/hr at 08/28/20 1409   PRN Meds: acetaminophen, albuterol, clonazePAM, HYDROcodone-acetaminophen, morphine injection, ondansetron **OR** ondansetron (ZOFRAN) IV  Time spent: 35 minutes  Author: 13/02/21, MD [facility] 08/30/2020 6:54 PM  To reach On-call, see care teams to locate the attending and reach out via [email or web address]. Between 7PM-7AM, please contact night-coverage If you still have difficulty reaching the attending provider, please page the [facility] (Director on Call) for [facility] on amion for assistance.

## 2020-08-31 ENCOUNTER — Inpatient Hospital Stay (HOSPITAL_COMMUNITY): Payer: Medicare Other

## 2020-08-31 DIAGNOSIS — M272 Inflammatory conditions of jaws: Secondary | ICD-10-CM | POA: Diagnosis not present

## 2020-08-31 LAB — MRSA PCR SCREENING: MRSA by PCR: NEGATIVE

## 2020-08-31 LAB — CBC WITH DIFFERENTIAL/PLATELET
Abs Immature Granulocytes: 0.05 10*3/uL (ref 0.00–0.07)
Basophils Absolute: 0 10*3/uL (ref 0.0–0.1)
Basophils Relative: 0 %
Eosinophils Absolute: 0.1 10*3/uL (ref 0.0–0.5)
Eosinophils Relative: 1 %
HCT: 36.1 % (ref 36.0–46.0)
Hemoglobin: 11.2 g/dL — ABNORMAL LOW (ref 12.0–15.0)
Immature Granulocytes: 1 %
Lymphocytes Relative: 27 %
Lymphs Abs: 2.7 10*3/uL (ref 0.7–4.0)
MCH: 28.5 pg (ref 26.0–34.0)
MCHC: 31 g/dL (ref 30.0–36.0)
MCV: 91.9 fL (ref 80.0–100.0)
Monocytes Absolute: 1.3 10*3/uL — ABNORMAL HIGH (ref 0.1–1.0)
Monocytes Relative: 13 %
Neutro Abs: 5.9 10*3/uL (ref 1.7–7.7)
Neutrophils Relative %: 58 %
Platelets: 374 10*3/uL (ref 150–400)
RBC: 3.93 MIL/uL (ref 3.87–5.11)
RDW: 14.1 % (ref 11.5–15.5)
WBC: 10.2 10*3/uL (ref 4.0–10.5)
nRBC: 0 % (ref 0.0–0.2)

## 2020-08-31 LAB — BASIC METABOLIC PANEL
Anion gap: 7 (ref 5–15)
BUN: <5 mg/dL — ABNORMAL LOW (ref 8–23)
CO2: 30 mmol/L (ref 22–32)
Calcium: 10.4 mg/dL — ABNORMAL HIGH (ref 8.9–10.3)
Chloride: 103 mmol/L (ref 98–111)
Creatinine, Ser: 0.83 mg/dL (ref 0.44–1.00)
GFR, Estimated: >60 mL/min (ref >60)
Glucose, Bld: 90 mg/dL (ref 70–99)
Potassium: 4.2 mmol/L (ref 3.5–5.1)
Sodium: 140 mmol/L (ref 135–145)

## 2020-08-31 MED ORDER — IOHEXOL 300 MG/ML  SOLN
75.0000 mL | Freq: Once | INTRAMUSCULAR | Status: AC | PRN
  Administered 2020-08-31: 75 mL via INTRAVENOUS

## 2020-08-31 MED ORDER — KETOROLAC TROMETHAMINE 30 MG/ML IJ SOLN
15.0000 mg | Freq: Four times a day (QID) | INTRAMUSCULAR | Status: DC
  Administered 2020-08-31 – 2020-09-01 (×4): 15 mg via INTRAVENOUS
  Filled 2020-08-31 (×4): qty 1

## 2020-08-31 NOTE — Progress Notes (Signed)
[facility] Progress Note  Patient: Brandilyn Nanninga    GEX:[id number]  DOA: 08/28/2020     Date of Service: the patient was seen and examined on 08/31/2020  Brief hospital course: Past medical history of HTN, hypothyroidism, obesity, anxiety.  Presents with complaints of toothache with left lower jaw swelling.  Underwent surgical extraction of teeth and IND of sublingual and submandibular abscess. Currently plan is continue IV antibiotics.  Assessment and Plan: 1.  Submandibular dental abscess SP teeth extraction and I&D Currently soft diet. Peridex. On IV ceftriaxone and oral Flagyl. Unable to tolerate penicillin and recently failed clindamycin just prior to hospitalization Cultures are growing gram-positive cocci. So far no growth Follow recommendation from dental surgery. Continue pain control. Oral surgery was planning a repeat I&D on 08/31/2020. CT scan was performed which shows improvement in the abscess therefore surgery currently canceled.  2.  Sinus tachycardia. Likely pain associated Monitor.  3.  Hypothyroidism Continue home Synthroid  4.  Anxiety, insomnia, depression Currently stable.  Continue home regimen.  5.  History of diarrhea Currently resolved.  Monitor.  6.  Obesity Placing the patient at high risk for poor outcome. Body mass index is 35.51 kg/m.   Diet: Soft diet DVT Prophylaxis:   enoxaparin (LOVENOX) injection 40 mg Start: 08/29/20 1000 SCD's Start: 08/28/20 1256  Advance goals of care discussion: Full code  Family Communication: no family was present at bedside, at the time of interview.  The pt provided permission to discuss medical plan with the family. Opportunity was given to ask question and all questions were answered satisfactorily.   Disposition:  Status is: Inpatient  Remains inpatient appropriate because:IV treatments appropriate due to intensity of illness or inability to take PO  Dispo: The patient is from: Home               Anticipated d/c is to: Home              Anticipated d/c date is: 2 days              Patient currently is not medically stable to d/c.  Subjective: No nausea no vomiting. No fever no chills. No chest pain. No abdominal pain. No diarrhea. Pain improving.  Physical Exam:  General: Appear in mild distress, no Rash; Oral Mucosa Clear, moist. no Abnormal Neck Mass Or lumps, Conjunctiva normal  Cardiovascular: S1 and S2 Present, no Murmur, Respiratory: good respiratory effort, Bilateral Air entry present and CTA, no Crackles, no wheezes Abdomen: Bowel Sound present, Soft and no tenderness Extremities: no Pedal edema Neurology: alert and oriented to time, place, and person affect appropriate. no new focal deficit Gait not checked due to patient safety concerns  Vitals:   08/30/20 0822 08/30/20 1951 08/31/20 0458 08/31/20 1352  BP: 132/71 138/80 125/66 (!) 142/77  Pulse: 72 72 73 85  Resp: 16 15 15 17   Temp: 98.5 F (36.9 C) 98.7 F (37.1 C) 99 F (37.2 C) 98.8 F (37.1 C)  TempSrc: Oral Oral Oral Oral  SpO2: 96% 94% 92% 95%  Weight:      Height:        Intake/Output Summary (Last 24 hours) at 08/31/2020 1811 Last data filed at 08/31/2020 1400 Gross per 24 hour  Intake 480 ml  Output --  Net 480 ml   Filed Weights   08/28/20 1327  Weight: 99.8 kg    Data Reviewed: I have personally reviewed and interpreted daily labs, tele strips, imagings as discussed above. I  reviewed all nursing notes, pharmacy notes, vitals, pertinent old records I have discussed plan of care as described above with RN and patient/family.  CBC: Recent Labs  Lab 08/28/20 1318 08/29/20 0153 08/30/20 0136 08/31/20 0356  WBC 15.0* 18.4* 14.7* 10.2  NEUTROABS 11.7*  --  10.3* 5.9  HGB 12.2 11.9* 10.7* 11.2*  HCT 38.8 37.4 34.2* 36.1  MCV 92.6 91.7 93.2 91.9  PLT 444* 459* 410* 374   Basic Metabolic Panel: Recent Labs  Lab 08/28/20 1318 08/29/20 0153 08/30/20 0136 08/31/20 0356  NA  137 138 139 140  K 3.0* 3.7 3.7 4.2  CL 100 101 103 103  CO2 25 27 29 30   GLUCOSE 115* 123* 98 90  BUN 6* 5* 5* <5*  CREATININE 0.82 0.84 0.85 0.83  CALCIUM 10.3 10.5* 10.1 10.4*  MG  --  1.9  --   --     Studies: CT SOFT TISSUE NECK W CONTRAST  Result Date: 08/31/2020 CLINICAL DATA:  Neck abscess, deep tissue. EXAM: CT NECK WITH CONTRAST TECHNIQUE: Multidetector CT imaging of the neck was performed using the standard protocol following the bolus administration of intravenous contrast. CONTRAST:  78mL OMNIPAQUE IOHEXOL 300 MG/ML  SOLN COMPARISON:  Neck CT 08/28/2020. FINDINGS: Pharynx and larynx: Surgical drains now terminate along the medial and lateral aspects of the left mandibular body. The previously described left sublingual space abscess has decreased in size and now demonstrates a more tubular configuration, measuring 1.5 x 0.4 cm in transaxial dimensions (series 3, image 73). This now has an appearance most suggestive of a dilated and inflamed left Wharton's duct. Persistent extensive surrounding phlegmon within the left floor of mouth contiguous with left submandibular space inflammation. As before, the left submandibular gland is inflamed and asymmetrically enlarged with heterogeneous enhancement. Interval increase in size of a rounded low-density focus within the posterior aspect of the gland now measuring 1.9 x 1.4 cm (series 5, image 60) (previously 1.8 x 1.2 cm). This finding is suspicious for abscess. There are smaller low-density foci within the left submandibular gland which may reflect dilated submandibular ducts or smaller abscess components. Associated cellulitis/phlegmon surrounding the left submandibular gland and in the left perimandibular region. There has been interval extraction of multiple left mandibular teeth. Salivary glands: See description of left submandibular gland inflammation above. The parotid glands are unremarkable. The right submandibular gland is atrophic.  Thyroid: Subcentimeter left thyroid lobe nodule not meeting consensus criteria for ultrasound follow-up. Lymph nodes: Mildly enlarged left level II lymph node measuring 12 mm in short axis (series 3, image 72). Additionally, upper cervical chain lymph nodes are generally increased in number. Vascular: The major vascular structures of the neck are patent. Limited intracranial: No acute intracranial abnormality is identified. Visualized orbits: No mass or acute finding. Mastoids and visualized paranasal sinuses: Minimal ethmoid sinus mucosal thickening. No significant mastoid effusion. Skeleton: No acute bony abnormality or aggressive osseous lesion. Upper chest: No consolidation within the imaged lung apices. IMPRESSION: Surgical drains now terminate along the medial and lateral aspects of the left mandibular body. There has been interval extraction of multiple left mandibular teeth. The previously described left sublingual space abscess has decreased in size and now demonstrates a more tubular configuration, with an appearance most suggestive of a dilated and inflamed left Wharton's duct. Persistent extensive surrounding phlegmon within the left floor of mouth contiguous with left submandibular space inflammation. Persistent extensive inflammation of the left submandibular gland. Suspicion for 1.9 cm abscess within the posterior aspect of  the gland. Additional smaller low-density foci within the left submandibular gland may reflect dilated submandibular ducts or smaller abscess components. Persistent cellulitis/phlegmon surrounding the left submandibular gland and extending to the left perimandibular region. Upper cervical lymphadenopathy, likely reactive. Electronically Signed   By: Jackey Loge DO   On: 08/31/2020 08:49    Scheduled Meds:  ARIPiprazole  15 mg Oral Daily   chlorhexidine  15 mL Mouth/Throat QID   Chlorhexidine Gluconate Cloth  6 each Topical Once   cholecalciferol  2,000 Units Oral Daily     DULoxetine  60 mg Oral BID   enoxaparin (LOVENOX) injection  40 mg Subcutaneous Q24H   hydrochlorothiazide  25 mg Oral Daily   ketorolac  15 mg Intravenous Q6H   levothyroxine  150 mcg Oral QAC breakfast   metroNIDAZOLE  500 mg Oral Q8H   pantoprazole  40 mg Oral Daily   trazodone  300 mg Oral QHS   verapamil  120 mg Oral Daily   cyanocobalamin  1,000 mcg Oral Daily   Continuous Infusions:  cefTRIAXone (ROCEPHIN)  IV 2 g (08/31/20 0526)   lactated ringers 10 mL/hr at 08/28/20 1409   PRN Meds: acetaminophen, albuterol, clonazePAM, HYDROcodone-acetaminophen, morphine injection, ondansetron **OR** ondansetron (ZOFRAN) IV  Time spent: 35 minutes  Author: Lynden Oxford, MD [facility] 08/31/2020 6:11 PM  To reach On-call, see care teams to locate the attending and reach out via [email or web address]. Between 7PM-7AM, please contact night-coverage If you still have difficulty reaching the attending provider, please page the [facility] (Director on Call) for [facility] on amion for assistance.

## 2020-08-31 NOTE — Plan of Care (Signed)
  Problem: Education: Goal: Knowledge of General Education information will improve Description: Including pain rating scale, medication(s)/side effects and non-pharmacologic comfort measures Outcome: Progressing   Problem: Health Behavior/Discharge Planning: Goal: Ability to manage health-related needs will improve Outcome: Progressing   Problem: Clinical Measurements: Goal: Will remain free from infection Outcome: Progressing   

## 2020-08-31 NOTE — Progress Notes (Signed)
Kim Gordon is an 64 y.o. female with left submandibular/sublingual space odontogenic infection.  ID: Patient referred  for evaluation of swelling of the left face/submandibular area  HPI: The patient reports pain and swelling that began weeks ago on the left side of her face and has progressively gotten worse.  The patient has taken a course of Clindamycin without much benefit. The patient reports trismus and dysphagia, denies fever or dyspnea.  Interval History 08/29/20 - POD #1. Patient reports feeling about the same overall. Continues to endorse dysphagia, able to swallow and breathe unobstructed. Reports continuous drainage from penrose. On soft diet. WBC bump from 15 to 18.  On exam, swelling slightly less firm in left submandibular/submental area. SS drainage from penrose. MIO 35 mm. Oropharynx clear w/o palatal draping. Intraorally, left FOM and vestibule remain raised/erythematous.  08/30/20 - POD#2. Patient reports feeling maybe "slightly better." Reports improvement in mouth opening and dysphagia however she believes the swelling in the FOM and submental areas are a little worse. Reports decreased drainage from penrose but has foul taste in her mouth. WBC down to 14.7 from 18. On exam she has increased erythema/edema in both the submental and left FOM. Intraorally there is increased purulence coming from the FOM. MIO 40 mm.   08/31/20 - POD #3. Patient feeling improvement in pain and swelling from last night to this morning. Drain output still low. WBC count normalized at 10.  Repeat CT scan taken, areas appear stable with possibly small fluid collection situated posteriorly to the buccal submandibular drain however this area was explored surgically. On exam cellulitis in submental area looks improved and is softer with less erythema. Slight serous drainage from penrose. MIO 35 mm. FOM remains raised and erythematous on the left side. No longer draining purulence intraorally.    Past  Medical History:  Diagnosis Date   Anemia    Anxiety    Arthritis    Carpal tunnel syndrome    Depression    Fibromyalgia    GERD (gastroesophageal reflux disease)    Hypertension    Hypothyroidism    Sjogren's syndrome (HCC)    Sleep apnea     Past Surgical History:  Procedure Laterality Date   APPENDECTOMY     CHOLECYSTECTOMY     COLONOSCOPY     DENTAL SURGERY  08/28/2020   INCISION AND DRAINAGE ABSCESS N/A 08/28/2020   Procedure: INCISION AND DRAINAGE FACIAL ABSCESS;  Surgeon: Enis Slipper, DMD;  Location: [facility] OR;  Service: Dentistry;  Laterality: N/A;   TOOTH EXTRACTION N/A 08/28/2020   Procedure: DENTAL EXTRACTION OF TEETH NUMBER NINETEEN, TWENTY, TWENTY-ONE, TWENTY-THREE, AND TWENTY-FOUR;  Surgeon: Enis Slipper, DMD;  Location: [facility] OR;  Service: Dentistry;  Laterality: N/A;   TUBAL LIGATION     WRIST FRACTURE SURGERY Left     History reviewed. No pertinent family history. Social History:  reports that she has never smoked. She has never used smokeless tobacco. She reports that she does not drink alcohol and does not use drugs.  Allergies:  Allergies  Allergen Reactions   Drug Ingredient [Sulfamethoxazole-Trimethoprim] Hives   Penicillins Hives    Medications Prior to Admission  Medication Sig Dispense Refill   ARIPiprazole (ABILIFY) 15 MG tablet Take 15 mg by mouth daily.     benztropine (COGENTIN) 0.5 MG tablet Take 0.5 mg by mouth in the morning and at bedtime.     Cholecalciferol 50 MCG (2000 UT) CAPS Take 2,000 Units by mouth daily.  clonazePAM (KLONOPIN) 0.5 MG tablet Take 0.5 mg by mouth 3 (three) times daily as needed for anxiety.     cyanocobalamin 1000 MCG tablet Take 1,000 mcg by mouth daily.     DULoxetine (CYMBALTA) 60 MG capsule Take 60 mg by mouth 2 (two) times daily.     esomeprazole (NEXIUM) 40 MG capsule Take 40 mg by mouth daily.     HYDROcodone-acetaminophen (NORCO) 7.5-325 MG tablet Take 1 tablet by  mouth every 6 (six) hours as needed for pain.     levothyroxine (SYNTHROID) 150 MCG tablet Take 150 mcg by mouth daily.     trazodone (DESYREL) 300 MG tablet Take 300 mg by mouth at bedtime.     verapamil (CALAN-SR) 120 MG CR tablet Take 120 mg by mouth daily.     albuterol (VENTOLIN HFA) 108 (90 Base) MCG/ACT inhaler Inhale 2 puffs into the lungs every 6 (six) hours as needed for wheezing or shortness of breath.     hydrochlorothiazide (HYDRODIURIL) 25 MG tablet Take 25 mg by mouth daily.      Results for orders placed or performed during the hospital encounter of 08/28/20 (from the past 48 hour(s))  CBC with Differential/Platelet     Status: Abnormal   Collection Time: 08/30/20  1:36 AM  Result Value Ref Range   WBC 14.7 (H) 4.0 - 10.5 K/uL   RBC 3.67 (L) 3.87 - 5.11 MIL/uL   Hemoglobin 10.7 (L) 12.0 - 15.0 g/dL   HCT 08.634.2 (L) 36 - 46 %   MCV 93.2 80.0 - 100.0 fL   MCH 29.2 26.0 - 34.0 pg   MCHC 31.3 30.0 - 36.0 g/dL   RDW 57.814.2 46.911.5 - 62.915.5 %   Platelets 410 (H) 150 - 400 K/uL   nRBC 0.0 0.0 - 0.2 %   Neutrophils Relative % 70 %   Neutro Abs 10.3 (H) 1.7 - 7.7 K/uL   Lymphocytes Relative 20 %   Lymphs Abs 3.0 0.7 - 4.0 K/uL   Monocytes Relative 9 %   Monocytes Absolute 1.4 (H) 0.1 - 1.0 K/uL   Eosinophils Relative 0 %   Eosinophils Absolute 0.0 0.0 - 0.5 K/uL   Basophils Relative 0 %   Basophils Absolute 0.0 0.0 - 0.1 K/uL   Immature Granulocytes 1 %   Abs Immature Granulocytes 0.07 0.00 - 0.07 K/uL    Comment: Performed at [facility], [address] [place], [place] [address]  Basic metabolic panel     Status: Abnormal   Collection Time: 08/30/20  1:36 AM  Result Value Ref Range   Sodium 139 135 - 145 mmol/L   Potassium 3.7 3.5 - 5.1 mmol/L   Chloride 103 98 - 111 mmol/L   CO2 29 22 - 32 mmol/L   Glucose, Bld 98 70 - 99 mg/dL    Comment: Glucose reference range applies only to samples taken after fasting for at least 8 hours.   BUN 5 (L) 8 - 23 mg/dL    Creatinine, Ser 3.240.85 0.44 - 1.00 mg/dL   Calcium 40.110.1 8.9 - 02.710.3 mg/dL   GFR, Estimated >25>60 >36>60 mL/min    Comment: (NOTE) Calculated using the CKD-EPI Creatinine Equation (2021)    Anion gap 7 5 - 15    Comment: Performed at [facility], [address] [place], [place] [address]  MRSA PCR Screening     Status: None   Collection Time: 08/30/20  8:50 PM   Specimen: Nasal Mucosa; Nasopharyngeal  Result Value Ref Range   MRSA by PCR NEGATIVE NEGATIVE    Comment:        The GeneXpert MRSA Assay (FDA approved for NASAL specimens only), is one component of a comprehensive MRSA colonization surveillance program. It is not intended to diagnose MRSA infection nor to guide or monitor treatment for MRSA infections. Performed at [facility], [address], [place], [place] [address]   CBC with Differential/Platelet     Status: Abnormal   Collection Time: 08/31/20  3:56 AM  Result Value Ref Range   WBC 10.2 4.0 - 10.5 K/uL   RBC 3.93 3.87 - 5.11 MIL/uL   Hemoglobin 11.2 (L) 12.0 - 15.0 g/dL   HCT 84.6 36 - 46 %   MCV 91.9 80.0 - 100.0 fL   MCH 28.5 26.0 - 34.0 pg   MCHC 31.0 30.0 - 36.0 g/dL   RDW 96.2 95.2 - 84.1 %   Platelets 374 150 - 400 K/uL   nRBC 0.0 0.0 - 0.2 %   Neutrophils Relative % 58 %   Neutro Abs 5.9 1.7 - 7.7 K/uL   Lymphocytes Relative 27 %   Lymphs Abs 2.7 0.7 - 4.0 K/uL   Monocytes Relative 13 %   Monocytes Absolute 1.3 (H) 0.1 - 1.0 K/uL   Eosinophils Relative 1 %   Eosinophils Absolute 0.1 0.0 - 0.5 K/uL   Basophils Relative 0 %   Basophils Absolute 0.0 0.0 - 0.1 K/uL   Immature Granulocytes 1 %   Abs Immature Granulocytes 0.05 0.00 - 0.07 K/uL    Comment: Performed at [facility], [address] [place], [place] [address]  Basic metabolic panel     Status: Abnormal   Collection Time: 08/31/20  3:56 AM  Result Value Ref Range   Sodium 140 135 - 145 mmol/L   Potassium 4.2 3.5 - 5.1 mmol/L   Chloride 103 98 - 111 mmol/L   CO2 30 22  - 32 mmol/L   Glucose, Bld 90 70 - 99 mg/dL    Comment: Glucose reference range applies only to samples taken after fasting for at least 8 hours.   BUN <5 (L) 8 - 23 mg/dL   Creatinine, Ser 1.02 0.44 - 1.00 mg/dL   Calcium 72.5 (H) 8.9 - 10.3 mg/dL   GFR, Estimated >36 >64 mL/min    Comment: (NOTE) Calculated using the CKD-EPI Creatinine Equation (2021)    Anion gap 7 5 - 15    Comment: Performed at [facility], [address], [place], [place] [address]   No results found.  Review of Systems  HENT: Positive for facial swelling and trouble swallowing. Negative for mouth sores.   All other systems reviewed and are negative.   Blood pressure 125/66, pulse 73, temperature 99 F (37.2 C), temperature source Oral, resp. rate 15, height 5\' 6"  (1.676 m), weight 99.8 kg, SpO2 92 %. Physical Exam Constitutional:      General: She is awake.     Appearance: Normal appearance.  HENT:     Head: Normocephalic.     Jaw: Trismus, tenderness, swelling and pain on movement present.     Salivary Glands: Left salivary gland is tender.     Mouth/Throat:     Dentition: Dental tenderness, dental caries and dental abscesses present.     Palate: No mass.     Pharynx: Oropharynx is clear. No pharyngeal swelling.  Cardiovascular:     Rate and Rhythm: Normal  rate and regular rhythm.  Pulmonary:     Effort: Pulmonary effort is normal.     Breath sounds: Normal breath sounds.  Musculoskeletal:     Cervical back: Normal range of motion. Edema and erythema present.  Neurological:     Mental Status: She is alert.  Psychiatric:        Behavior: Behavior is cooperative.      Assessment/Plan 63 yoF with left submandibular, sublingual space odontogenic infection with resulting trismus and dysphagia with multiple carious, non-restorable teeth s/p transcutaneous I&D and extraction of teeth #19-21, 23-24 on 08/28/20.   Overall patient is stable showing slow improvement.  Will continue to monitor  patient.  OMFS Recommendation -Given clinical improvement, improved WBC count and stable CT findings we will hold off on repeat I&D; patient may resume soft diet -Daily CBCs w/ diff, will follow cultures -Continue IV ceftriaxone + flagyl -Peridex mouthrinse QID -Continue soft chew diet -Change Kerlix dressing when saturated prn -Will leave drains in place until discharge, keep inpatient until patient's clinical course improves   Enis Slipper, DMD 08/31/2020, 7:56 AM

## 2020-08-31 NOTE — Plan of Care (Signed)
Patient is alert and oriented x4. Ambulates to the bathroom. , c/o pain to left jaw, received prn Norco, also on scheduled IV Toradol. Patient is NPO for surgery today. Problem: Education: Goal: Knowledge of General Education information will improve Description: Including pain rating scale, medication(s)/side effects and non-pharmacologic comfort measures Outcome: Progressing   Problem: Health Behavior/Discharge Planning: Goal: Ability to manage health-related needs will improve Outcome: Progressing   Problem: Clinical Measurements: Goal: Ability to maintain clinical measurements within normal limits will improve Outcome: Progressing Goal: Will remain free from infection Outcome: Progressing Goal: Diagnostic test results will improve Outcome: Progressing Goal: Respiratory complications will improve Outcome: Progressing Goal: Cardiovascular complication will be avoided Outcome: Progressing   Problem: Activity: Goal: Risk for activity intolerance will decrease Outcome: Progressing   Problem: Nutrition: Goal: Adequate nutrition will be maintained Outcome: Progressing   Problem: Coping: Goal: Level of anxiety will decrease Outcome: Progressing   Problem: Elimination: Goal: Will not experience complications related to bowel motility Outcome: Progressing Goal: Will not experience complications related to urinary retention Outcome: Progressing   Problem: Pain Managment: Goal: General experience of comfort will improve Outcome: Progressing   Problem: Safety: Goal: Ability to remain free from injury will improve Outcome: Progressing   Problem: Skin Integrity: Goal: Risk for impaired skin integrity will decrease Outcome: Progressing

## 2020-09-01 DIAGNOSIS — M272 Inflammatory conditions of jaws: Secondary | ICD-10-CM | POA: Diagnosis not present

## 2020-09-01 LAB — CBC WITH DIFFERENTIAL/PLATELET
Abs Immature Granulocytes: 0.03 10*3/uL (ref 0.00–0.07)
Basophils Absolute: 0.1 10*3/uL (ref 0.0–0.1)
Basophils Relative: 1 %
Eosinophils Absolute: 0.2 10*3/uL (ref 0.0–0.5)
Eosinophils Relative: 2 %
HCT: 39.4 % (ref 36.0–46.0)
Hemoglobin: 12.2 g/dL (ref 12.0–15.0)
Immature Granulocytes: 0 %
Lymphocytes Relative: 24 %
Lymphs Abs: 2 10*3/uL (ref 0.7–4.0)
MCH: 28.4 pg (ref 26.0–34.0)
MCHC: 31 g/dL (ref 30.0–36.0)
MCV: 91.8 fL (ref 80.0–100.0)
Monocytes Absolute: 1.3 10*3/uL — ABNORMAL HIGH (ref 0.1–1.0)
Monocytes Relative: 15 %
Neutro Abs: 4.9 10*3/uL (ref 1.7–7.7)
Neutrophils Relative %: 58 %
Platelets: 414 10*3/uL — ABNORMAL HIGH (ref 150–400)
RBC: 4.29 MIL/uL (ref 3.87–5.11)
RDW: 14 % (ref 11.5–15.5)
WBC: 8.5 10*3/uL (ref 4.0–10.5)
nRBC: 0 % (ref 0.0–0.2)

## 2020-09-01 LAB — BASIC METABOLIC PANEL
Anion gap: 10 (ref 5–15)
BUN: 6 mg/dL — ABNORMAL LOW (ref 8–23)
CO2: 28 mmol/L (ref 22–32)
Calcium: 10.3 mg/dL (ref 8.9–10.3)
Chloride: 99 mmol/L (ref 98–111)
Creatinine, Ser: 0.84 mg/dL (ref 0.44–1.00)
GFR, Estimated: >60 mL/min (ref >60)
Glucose, Bld: 106 mg/dL — ABNORMAL HIGH (ref 70–99)
Potassium: 3.5 mmol/L (ref 3.5–5.1)
Sodium: 137 mmol/L (ref 135–145)

## 2020-09-01 MED ORDER — CHLORHEXIDINE GLUCONATE 0.12 % MT SOLN: 15.0000 mL | Freq: Four times a day (QID) | OROMUCOSAL | 0 refills | Status: AC

## 2020-09-01 MED ORDER — CEFDINIR 300 MG PO CAPS: 300.0000 mg | ORAL_CAPSULE | Freq: Two times a day (BID) | ORAL | 0 refills | Status: AC

## 2020-09-01 MED ORDER — METRONIDAZOLE 500 MG PO TABS: 500.0000 mg | ORAL_TABLET | Freq: Three times a day (TID) | ORAL | 0 refills | Status: AC

## 2020-09-01 MED ORDER — NAPROXEN 500 MG PO TBEC: 500.0000 mg | DELAYED_RELEASE_TABLET | Freq: Two times a day (BID) | ORAL | 0 refills | Status: AC

## 2020-09-01 NOTE — Progress Notes (Signed)
Dsicharge summary packet provided to pt with instructions. Pt verbalized understanding of instructions with no complaints voiced.,Pt remains alert/oriented in no apparent distress.D/C to home as ordered. Sister would be responsible for pt's ride back home.

## 2020-09-02 LAB — AEROBIC/ANAEROBIC CULTURE W GRAM STAIN (SURGICAL/DEEP WOUND): Culture: NORMAL

## 2020-09-03 NOTE — Discharge Summary (Signed)
[facility] Discharge Summary   Patient: Kim Gordon ZOX:[id number]  PCP: Majel Homer, MD  Date of admission: 08/28/2020   Date of discharge: 09/01/2020     Discharge Diagnoses:  Principal diagnosis Submandibular abscess S/p I and D and Teeth extraction  Active Problems:   Mouth abscess   Essential hypertension   Hypokalemia   Leukocytosis   Anxiety   Hypothyroid   Submandibular abscess   Admitted From: home Disposition:  Home   Recommendations for Outpatient Follow-up:  1. PCP: follow up with PCP and OMFS as recommended  2. Follow up LABS/TEST:  none   Follow-up Information    Majel Homer, MD. Schedule an appointment as soon as possible for a visit in 1 week(s).   Specialty: Internal Medicine Contact information: [address] [place] [address] [phone]        Enis Slipper, DMD. Schedule an appointment as soon as possible for a visit in 1 week(s).   Specialty: Oral Surgery Contact information: [address] [place] [address] [phone]              Diet recommendation: Cardiac diet  Activity: The patient is advised to gradually reintroduce usual activities, as tolerated  Discharge Condition: stable  Code Status: Full code   History of present illness: As per the H and P dictated on admission, "Kim Gordon is a 64 y.o. female with medical history significant for hypertension, hypothyroid, obesity, anxiety/depression presented for chief concern of tooth pain in the left lower jaw.  She reports this has been ongoing for several weeks.  She reports that her tooth broke.  She does not know why her tooth broke as she did not eat anything hard.  Review of system was negative for fever, chills, headache, vision changes, nausea, vomiting, chest pain, shortness of breath, abdominal pain, constipation, dysuria, hematuria, insomnia.  She endorses chronic low back pain and watery stools for 3 to 4 days due to  only drinking liquids.  She reports this is never happened before.  She was taken to the OR by Dr. Ross Marcus.  She states that she did not take any of her medications this morning including her blood pressure medicine or her thyroid medicine.  Social history: Denies tobacco use, alcohol use and recreational drug use."  Hospital Course:  Summary of her active problems in the hospital is as following. 1. Submandibular dental abscess SP teeth extraction and I&D Currently soft diet. Peridex. On IV ceftriaxone and oral Flagyl. Unable to tolerate penicillin and recently failed clindamycin just prior to hospitalization Cultures are growing gram-positive cocci. So far no growth Continue pain control. Oral surgery was planning a repeat I&D on 08/31/2020. CT scan was performed which shows improvement in the abscess therefore surgery currently canceled. Now discharging with oral omnicef and flagyl for 7 more days.   2. Sinus tachycardia. Likely pain associated, resolved   3. Hypothyroidism Continue home Synthroid  4. Anxiety, insomnia, depression Currently stable. Continue home regimen.  5. History of diarrhea Currently resolved. Monitor.  6. Obesity Outpatient follow up with PCP and dietary consultation recommended  Body mass index is 35.51 kg/m.   Pain control  - [employer] Controlled Substance Reporting System database was reviewed. - Patient was instructed, not to drive, operate heavy machinery, perform activities at heights, swimming or participation in water activities or provide baby sitting services while on Pain, Sleep and Anxiety Medications; until her outpatient Physician has advised to do so again.  -  Also recommended to not to take more than prescribed Pain, Sleep and Anxiety Medications.  Patient was ambulatory without any assistance. On the day of the discharge the patient's vitals were stable, and no other acute medical condition were reported by  patient. The patient was felt safe to be discharge at Home with no therapy needed on discharge.  Consultants: OMFS Procedures:  Surgical extraction teeth #19-21, 23-24   Extra-oral I&D of sublingual abscess  Extra-oral I&D of submandibular abscess   Discharge Exam: General: Appear in no distress, no Rash; Oral Mucosa Clear, moist. no Abnormal Neck Mass Or lumps, Conjunctiva normal  Cardiovascular: S1 and S2 Present, no Murmur Respiratory: good respiratory effort, Bilateral Air entry present and CTA, no Crackles, no wheezes Abdomen: Bowel Sound present, Soft and no tenderness Extremities: no Pedal edema Neurology: alert and oriented to time, place, and person affect appropriate. no new focal deficit  Filed Weights   08/28/20 1327  Weight: 99.8 kg   Vitals:   08/31/20 2134 09/01/20 0657  BP: 137/78 128/69  Pulse: 75 90  Resp: 18 18  Temp: 99.3 F (37.4 C) 98.9 F (37.2 C)  SpO2: 94% 93%    DISCHARGE MEDICATION: Allergies as of 09/01/2020      Reactions   Drug Ingredient [sulfamethoxazole-trimethoprim] Hives   Penicillins Hives      Medication List    TAKE these medications   albuterol 108 (90 Base) MCG/ACT inhaler Commonly known as: VENTOLIN HFA Inhale 2 puffs into the lungs every 6 (six) hours as needed for wheezing or shortness of breath.   ARIPiprazole 15 MG tablet Commonly known as: ABILIFY Take 15 mg by mouth daily.   benztropine 0.5 MG tablet Commonly known as: COGENTIN Take 0.5 mg by mouth in the morning and at bedtime.   cefdinir 300 MG capsule Commonly known as: OMNICEF Take 1 capsule (300 mg total) by mouth 2 (two) times daily for 7 days.   chlorhexidine 0.12 % solution Commonly known as: PERIDEX Use as directed 15 mLs in the mouth or throat 4 (four) times daily.   Cholecalciferol 50 MCG (2000 UT) Caps Take 2,000 Units by mouth daily.   clonazePAM 0.5 MG tablet Commonly known as: KLONOPIN Take 0.5 mg by mouth 3 (three) times daily as needed  for anxiety.   cyanocobalamin 1000 MCG tablet Take 1,000 mcg by mouth daily.   DULoxetine 60 MG capsule Commonly known as: CYMBALTA Take 60 mg by mouth 2 (two) times daily.   esomeprazole 40 MG capsule Commonly known as: NEXIUM Take 40 mg by mouth daily.   hydrochlorothiazide 25 MG tablet Commonly known as: HYDRODIURIL Take 25 mg by mouth daily.   HYDROcodone-acetaminophen 7.5-325 MG tablet Commonly known as: NORCO Take 1 tablet by mouth every 6 (six) hours as needed for pain.   levothyroxine 150 MCG tablet Commonly known as: SYNTHROID Take 150 mcg by mouth daily.   metroNIDAZOLE 500 MG tablet Commonly known as: FLAGYL Take 1 tablet (500 mg total) by mouth 3 (three) times daily for 7 days.   naproxen 500 MG EC tablet Commonly known as: EC NAPROSYN Take 1 tablet (500 mg total) by mouth 2 (two) times daily with a meal for 7 days.   trazodone 300 MG tablet Commonly known as: DESYREL Take 300 mg by mouth at bedtime.   verapamil 120 MG CR tablet Commonly known as: CALAN-SR Take 120 mg by mouth daily.            Discharge Care Instructions  (From  admission, onward)         Start     Ordered   09/01/20 0000  Leave dressing on - Keep it clean, dry, and intact until clinic visit        09/01/20 1143         Allergies  Allergen Reactions  . Drug Ingredient [Sulfamethoxazole-Trimethoprim] Hives  . Penicillins Hives   Discharge Instructions    Diet - low sodium heart healthy   Complete by: As directed    Increase activity slowly   Complete by: As directed    Leave dressing on - Keep it clean, dry, and intact until clinic visit   Complete by: As directed    No wound care   Complete by: As directed       The results of significant diagnostics from this hospitalization (including imaging, microbiology, ancillary and laboratory) are listed below for reference.    Significant Diagnostic Studies: DG Orthopantogram  Result Date: 08/28/2020 CLINICAL DATA:   Infection.  Abscesses. EXAM: ORTHOPANTOGRAM/PANORAMIC COMPARISON:  None. FINDINGS: Maxillary dentition: 1 and 2 absent. Previous root canal tooth 4 with advanced decay. Advanced decay of tooth 6. Missing teeth 13 through 16. Mandibular dentition: Crown tooth 17. Previous root canals at 18 and 19 with advanced decay. Advanced decay of tooth 20 with periapical lucency. Advanced decay teeth 23 and 24. Advanced decay tooth 25. Advanced decay tooth 28 with periapical lucency. Teeth 29 through 32 absent. IMPRESSION: Advanced dental and periodontal disease as above. Electronically Signed   By: Paulina Fusi M.D.   On: 08/28/2020 14:16   CT SOFT TISSUE NECK W CONTRAST  Result Date: 08/31/2020 CLINICAL DATA:  Neck abscess, deep tissue. EXAM: CT NECK WITH CONTRAST TECHNIQUE: Multidetector CT imaging of the neck was performed using the standard protocol following the bolus administration of intravenous contrast. CONTRAST:  75mL OMNIPAQUE IOHEXOL 300 MG/ML  SOLN COMPARISON:  Neck CT 08/28/2020. FINDINGS: Pharynx and larynx: Surgical drains now terminate along the medial and lateral aspects of the left mandibular body. The previously described left sublingual space abscess has decreased in size and now demonstrates a more tubular configuration, measuring 1.5 x 0.4 cm in transaxial dimensions (series 3, image 73). This now has an appearance most suggestive of a dilated and inflamed left Wharton's duct. Persistent extensive surrounding phlegmon within the left floor of mouth contiguous with left submandibular space inflammation. As before, the left submandibular gland is inflamed and asymmetrically enlarged with heterogeneous enhancement. Interval increase in size of a rounded low-density focus within the posterior aspect of the gland now measuring 1.9 x 1.4 cm (series 5, image 60) (previously 1.8 x 1.2 cm). This finding is suspicious for abscess. There are smaller low-density foci within the left submandibular gland which may  reflect dilated submandibular ducts or smaller abscess components. Associated cellulitis/phlegmon surrounding the left submandibular gland and in the left perimandibular region. There has been interval extraction of multiple left mandibular teeth. Salivary glands: See description of left submandibular gland inflammation above. The parotid glands are unremarkable. The right submandibular gland is atrophic. Thyroid: Subcentimeter left thyroid lobe nodule not meeting consensus criteria for ultrasound follow-up. Lymph nodes: Mildly enlarged left level II lymph node measuring 12 mm in short axis (series 3, image 72). Additionally, upper cervical chain lymph nodes are generally increased in number. Vascular: The major vascular structures of the neck are patent. Limited intracranial: No acute intracranial abnormality is identified. Visualized orbits: No mass or acute finding. Mastoids and visualized paranasal sinuses: Minimal ethmoid  sinus mucosal thickening. No significant mastoid effusion. Skeleton: No acute bony abnormality or aggressive osseous lesion. Upper chest: No consolidation within the imaged lung apices. IMPRESSION: Surgical drains now terminate along the medial and lateral aspects of the left mandibular body. There has been interval extraction of multiple left mandibular teeth. The previously described left sublingual space abscess has decreased in size and now demonstrates a more tubular configuration, with an appearance most suggestive of a dilated and inflamed left Wharton's duct. Persistent extensive surrounding phlegmon within the left floor of mouth contiguous with left submandibular space inflammation. Persistent extensive inflammation of the left submandibular gland. Suspicion for 1.9 cm abscess within the posterior aspect of the gland. Additional smaller low-density foci within the left submandibular gland may reflect dilated submandibular ducts or smaller abscess components. Persistent  cellulitis/phlegmon surrounding the left submandibular gland and extending to the left perimandibular region. Upper cervical lymphadenopathy, likely reactive. Electronically Signed   By: Jackey LogeKyle  Golden DO   On: 08/31/2020 08:49   CT SOFT TISSUE NECK W CONTRAST  Result Date: 08/28/2020 CLINICAL DATA:  Left neck abscess EXAM: CT NECK WITH CONTRAST TECHNIQUE: Multidetector CT imaging of the neck was performed using the standard protocol following the bolus administration of intravenous contrast. CONTRAST:  75mL OMNIPAQUE IOHEXOL 300 MG/ML  SOLN COMPARISON:  None. FINDINGS: Pharynx and larynx: Inflammatory process detailed below is contiguous with the lateral left oropharyngeal wall. Otherwise unremarkable. Airway is patent. Salivary glands: Normal left submandibular gland is not seen. There is ill-defined soft tissue enhancement within the submandibular space also involving the sublingual space. Infiltration of surrounding fat. There is a 2 x 1 x 1.1 cm peripherally enhancing left sublingual collection. More posterior superiorly there may be another developing collection seen on series 3, image 72. Left mandibular tooth decay is present without periapical lucency or dehiscence. Both parotid and right submandibular glands are unremarkable apart from fatty replacement. Thyroid: Normal. Lymph nodes: Nonenlarged but increased number of cervical lymph nodes are likely reactive. Vascular: Major neck vessels are patent. Limited intracranial: No abnormal enhancement. Visualized orbits: Unremarkable. Mastoids and visualized paranasal sinuses: No significant opacification. Skeleton: Degenerative changes of the cervical spine. Degenerative changes at the right temporomandibular joint. Upper chest: No apical lung mass. Other: None. IMPRESSION: Left submandibular and sublingual phlegmon with 2 cm left sublingual abscess. Possible additional developing abscess more posterosuperiorly. Electronically Signed   By: Guadlupe SpanishPraneil  Nayleen Janosik M.D.    On: 08/28/2020 15:38    Microbiology: Recent Results (from the past 240 hour(s))  SARS Coronavirus 2 by RT PCR (hospital order, [facility]) Nasopharyngeal Nasopharyngeal Swab     Status: None   Collection Time: 08/28/20  1:11 PM   Specimen: Nasopharyngeal Swab  Result Value Ref Range Status   SARS Coronavirus 2 NEGATIVE NEGATIVE Final    Comment: (NOTE) SARS-CoV-2 target nucleic acids are NOT DETECTED.  The SARS-CoV-2 RNA is generally detectable in upper and lower respiratory specimens during the acute phase of infection. The lowest concentration of SARS-CoV-2 viral copies this assay can detect is 250 copies / mL. A negative result does not preclude SARS-CoV-2 infection and should not be used as the sole basis for treatment or other patient management decisions.  A negative result may occur with improper specimen collection / handling, submission of specimen other than nasopharyngeal swab, presence of viral mutation(s) within the areas targeted by this assay, and inadequate number of viral copies (<250 copies / mL). A negative result must be combined with clinical observations,  patient history, and epidemiological information.  Fact Sheet for Patients:   BoilerBrush.com.cy  Fact Sheet for Healthcare Providers: https://pope.com/  This test is not yet approved or  cleared by the [place] FDA and has been authorized for detection and/or diagnosis of SARS-CoV-2 by FDA under an Emergency Use Authorization (EUA).  This EUA will remain in effect (meaning this test can be used) for the duration of the COVID-19 declaration under Section 564(b)(1) of the Act, 21 U.S.C. section 360bbb-3(b)(1), unless the authorization is terminated or revoked sooner.  Performed at [facility], [address] [place], [place] [address]   Fungus Culture With Stain     Status: None (Preliminary result)   Collection  Time: 08/28/20  4:45 PM   Specimen: Abscess  Result Value Ref Range Status   Fungus Stain Final report  Final    Comment: (NOTE) Performed At: [facility] [address] [place], [place] [id number] Jolene Schimke MD GT:[id number]    Fungus (Mycology) Culture PENDING  Incomplete   Fungal Source ABSCESS  Final    Comment: LEFT MOUTH Performed at [facility], [address], [place], [place] [address]   Aerobic/Anaerobic Culture (surgical/deep wound)     Status: None   Collection Time: 08/28/20  4:45 PM   Specimen: Abscess  Result Value Ref Range Status   Specimen Description ABSCESS LEFT MOUTH  Final   Special Requests PATIENT ON FOLLOWING ROCEPHIN FLAGYL  Final   Gram Stain   Final    MODERATE WBC PRESENT, PREDOMINANTLY PMN FEW SQUAMOUS EPITHELIAL CELLS PRESENT MODERATE GRAM POSITIVE COCCI IN PAIRS Performed at [facility], [address] [place], [place] [address]    Culture   Final    RARE NORMAL OROPHARYNGEAL FLORA MIXED ANAEROBIC FLORA PRESENT.  CALL LAB IF FURTHER IID REQUIRED.    Report Status 09/02/2020 FINAL  Final  Fungus Culture Result     Status: None   Collection Time: 08/28/20  4:45 PM  Result Value Ref Range Status   Result 1 Comment  Final    Comment: (NOTE) KOH/Calcofluor preparation:  no fungus observed. Performed At: [facility] [address] [place], [place] [id number] Jolene Schimke MD [id number]   MRSA PCR Screening     Status: None   Collection Time: 08/30/20  8:50 PM   Specimen: Nasal Mucosa; Nasopharyngeal  Result Value Ref Range Status   MRSA by PCR NEGATIVE NEGATIVE Final    Comment:        The GeneXpert MRSA Assay (FDA approved for NASAL specimens only), is one component of a comprehensive MRSA colonization surveillance program. It is not intended to diagnose MRSA infection nor to guide or monitor treatment for MRSA infections. Performed at [facility], [address] [place], [place]  [address]      Labs: CBC: Recent Labs  Lab 08/28/20 1318 08/29/20 0153 08/30/20 0136 08/31/20 0356 09/01/20 0756  WBC 15.0* 18.4* 14.7* 10.2 8.5  NEUTROABS 11.7*  --  10.3* 5.9 4.9  HGB 12.2 11.9* 10.7* 11.2* 12.2  HCT 38.8 37.4 34.2* 36.1 39.4  MCV 92.6 91.7 93.2 91.9 91.8  PLT 444* 459* 410* 374 414*   Basic Metabolic Panel: Recent Labs  Lab 08/28/20 1318 08/29/20 0153 08/30/20 0136 08/31/20 0356 09/01/20 0756  NA 137 138 139 140 137  K 3.0* 3.7 3.7 4.2 3.5  CL 100 101 103 103 99  CO2 25 27 29 30 28   GLUCOSE 115* 123* 98 90  106*  BUN 6* 5* 5* <5* 6*  CREATININE 0.82 0.84 0.85 0.83 0.84  CALCIUM 10.3 10.5* 10.1 10.4* 10.3  MG  --  1.9  --   --   --    Liver Function Tests: Recent Labs  Lab 08/29/20 0153  AST 16  ALT 11  ALKPHOS 94  BILITOT 0.3  PROT 7.5  ALBUMIN 3.0*   CBG: No results for input(s): GLUCAP in the last 168 hours.  Time spent: 35 minutes  Signed:  Lynden Oxford  [facility] 09/01/2020 8:07 AM

## 2020-09-04 ENCOUNTER — Other Ambulatory Visit: Payer: Self-pay | Admitting: Internal Medicine

## 2020-09-04 LAB — FUNGUS CULTURE WITH STAIN

## 2020-09-04 LAB — FUNGAL ORGANISM REFLEX

## 2020-09-04 LAB — FUNGUS CULTURE RESULT

## 2020-09-04 MED ORDER — FLUCONAZOLE 100 MG PO TABS: ORAL_TABLET | ORAL | 0 refills | Status: AC

## 2021-09-26 IMAGING — CT CT NECK W/ CM
4 of 6 series · 13 of 33 positions shown, 15 images · IV contrast (omnipaque)
Comparison: Neck CT 08/28/2020.

CLINICAL DATA: Neck abscess, deep tissue.

EXAM:
CT NECK WITH CONTRAST
TECHNIQUE: Multidetector CT imaging of the neck was performed using the
standard protocol following the bolus administration of intravenous
contrast.
CONTRAST:  75mL OMNIPAQUE IOHEXOL 300 MG/ML  SOLN

[Series 3: axial neck · axial · 0.37mm/px · z∈[-203,-59]mm · 3 of 145 slices shown, 4 images]
[im 37/145  soft-tissue]
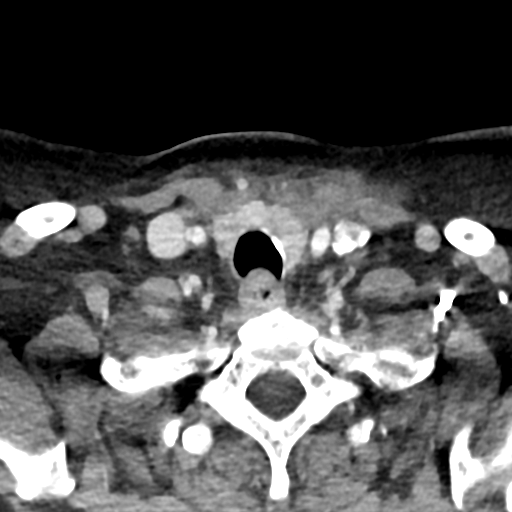
[im 37/145  bone]
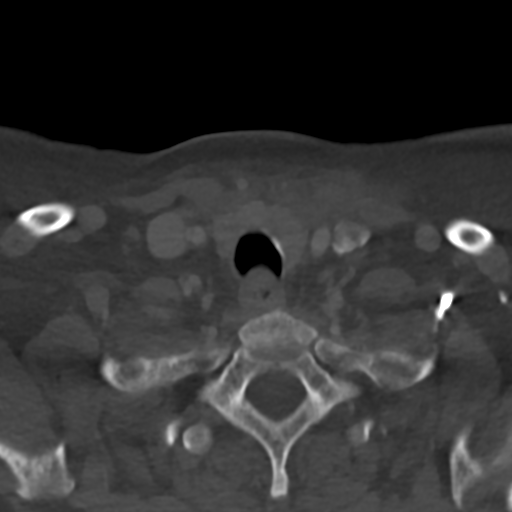
[im 73/145  bone]
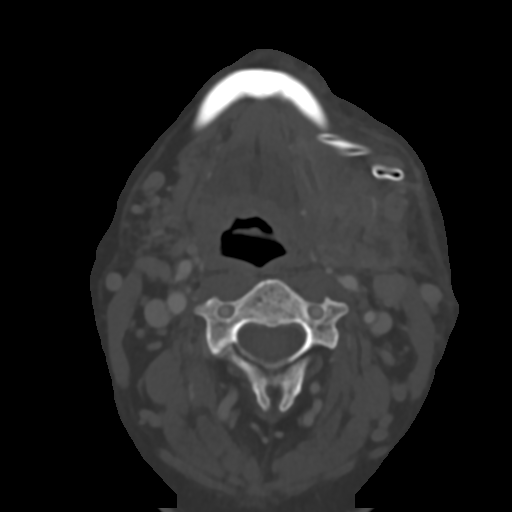
[im 109/145  bone]
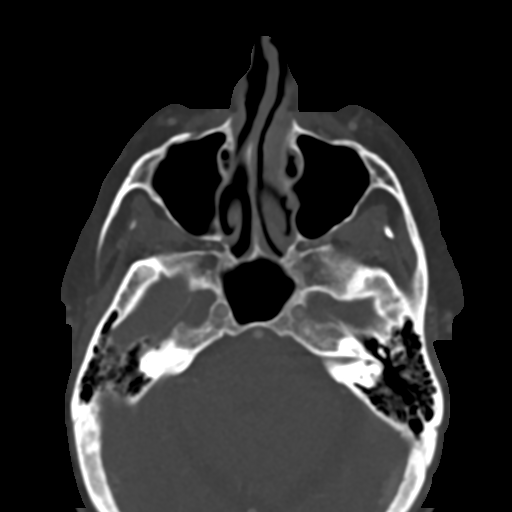

[Series 4: axial bone · axial · 0.37mm/px · z∈[-179,-83]mm · 2 of 145 slices shown]
[im 49/145  bone]
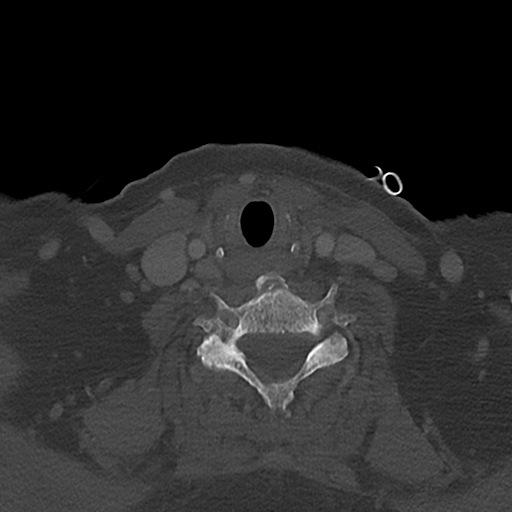
[im 97/145  bone]
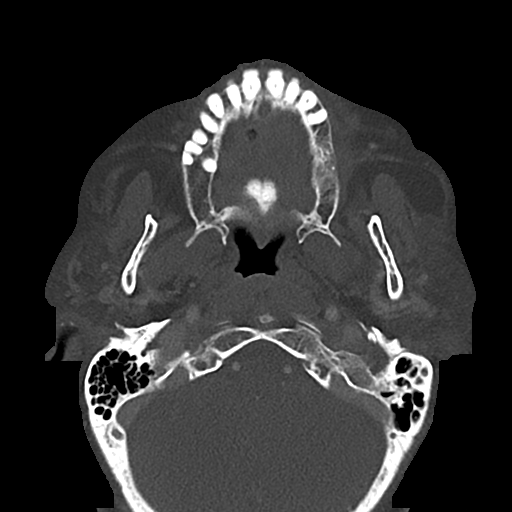

[Series 5: sag neck · sagittal · 0.42mm/px · 5 of 80 slices shown, 6 images]
[im 27/80  bone]
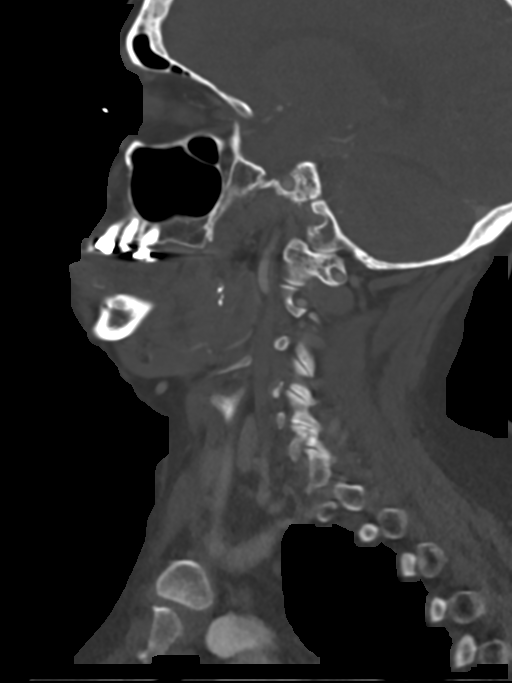
[im 33/80  bone]
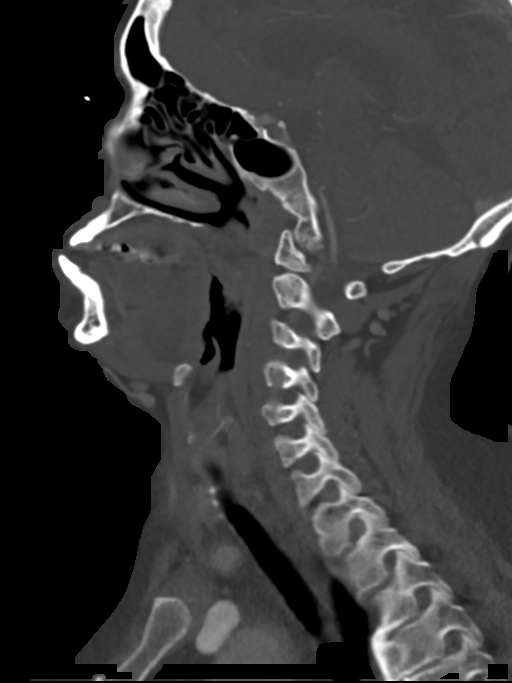
[im 40/80  soft-tissue]
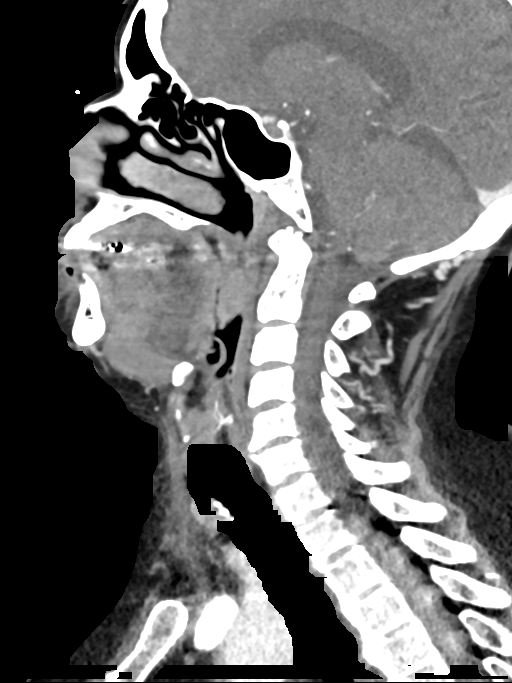
[im 40/80  bone]
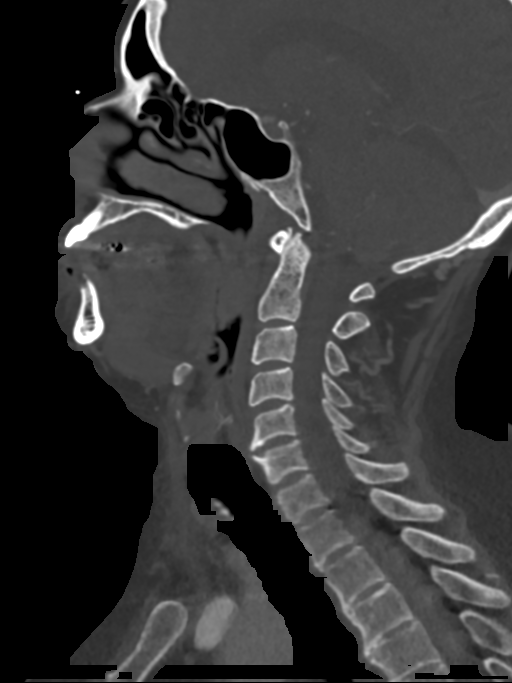
[im 47/80  bone]
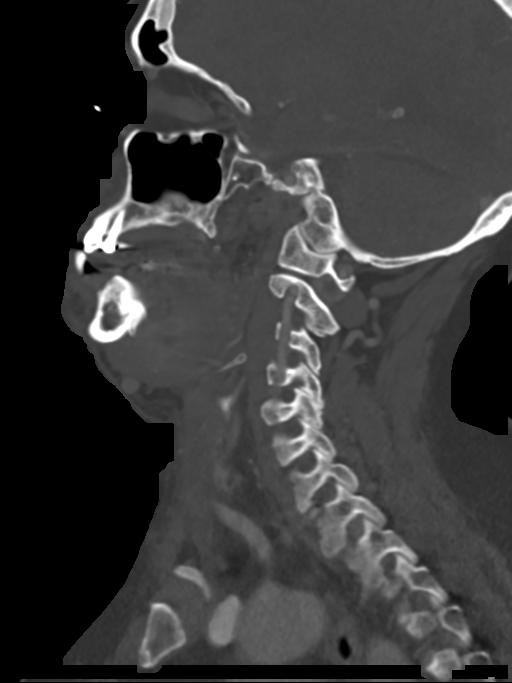
[im 53/80  bone]
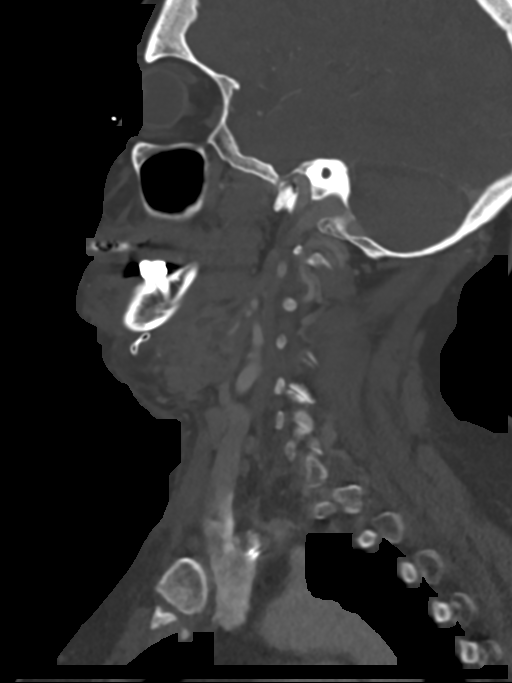

[Series 6: cor neck · coronal · 0.44mm/px · 3 of 121 slices shown]
[im 25/121  bone]
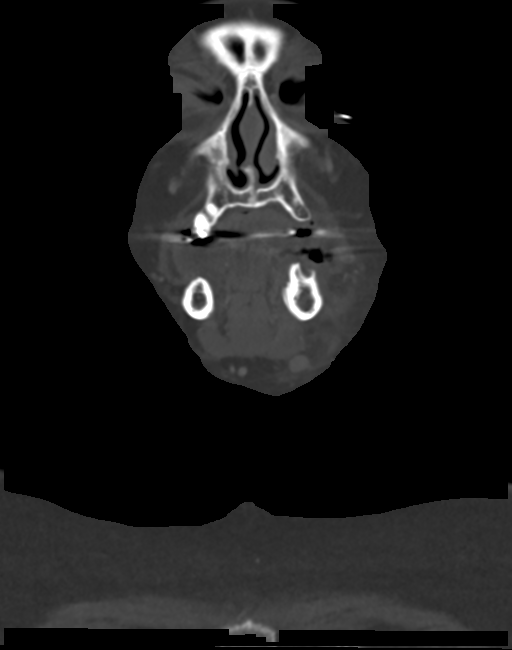
[im 49/121  bone]
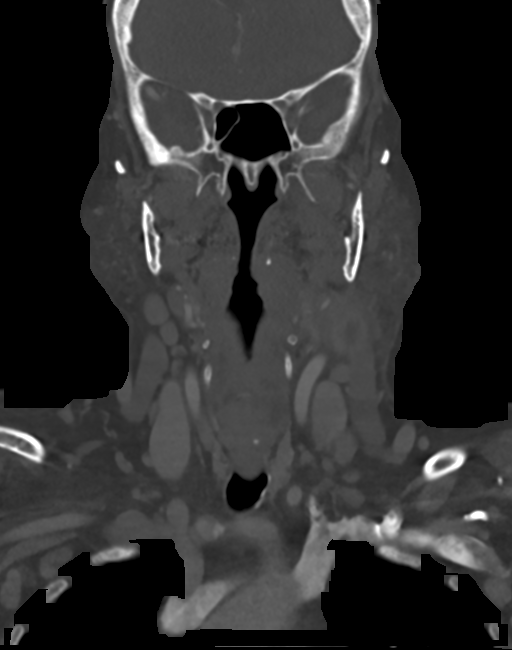
[im 73/121  bone]
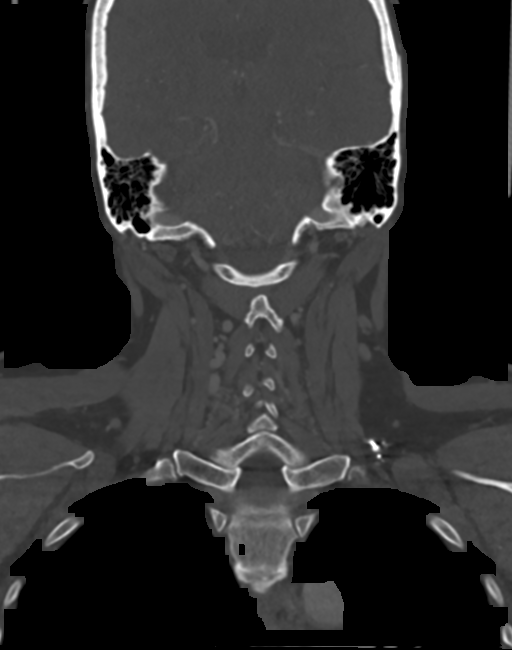

[13 of 33 positions shown; findings below may reference images not displayed]

FINDINGS: Pharynx and larynx: Surgical drains now terminate along the medial
and lateral aspects of the left mandibular body. The previously
described left sublingual space abscess has decreased in size and
now demonstrates a more tubular configuration, measuring 1.5 x
cm in transaxial dimensions (series 3, image 73). This now has an
appearance most suggestive of a dilated and inflamed left Wharton's
duct. Persistent extensive surrounding phlegmon within the left
floor of mouth contiguous with left submandibular space
inflammation. As before, the left submandibular gland is inflamed
and asymmetrically enlarged with heterogeneous enhancement. Interval
increase in size of a rounded low-density focus within the posterior
aspect of the gland now measuring 1.9 x 1.4 cm (series 5, image 60)
(previously 1.8 x 1.2 cm). This finding is suspicious for abscess.
There are smaller low-density foci within the left submandibular
gland which may reflect dilated submandibular ducts or smaller
abscess components. Associated cellulitis/phlegmon surrounding the
left submandibular gland and in the left perimandibular region.
There has been interval extraction of multiple left mandibular
teeth.

Salivary glands: See description of left submandibular gland
inflammation above. The parotid glands are unremarkable. The right
submandibular gland is atrophic.

Thyroid: Subcentimeter left thyroid lobe nodule not meeting
consensus criteria for ultrasound follow-up.

Lymph nodes: Mildly enlarged left level II lymph node measuring 12
mm in short axis (series 3, image 72). Additionally, upper cervical
chain lymph nodes are generally increased in number.

Vascular: The major vascular structures of the neck are patent.

Limited intracranial: No acute intracranial abnormality is
identified.

Visualized orbits: No mass or acute finding.

Mastoids and visualized paranasal sinuses: Minimal ethmoid sinus
mucosal thickening. No significant mastoid effusion.

Skeleton: No acute bony abnormality or aggressive osseous lesion.

Upper chest: No consolidation within the imaged lung apices.
IMPRESSION: Surgical drains now terminate along the medial and lateral aspects
of the left mandibular body. There has been interval extraction of
multiple left mandibular teeth.

The previously described left sublingual space abscess has decreased
in size and now demonstrates a more tubular configuration, with an
appearance most suggestive of a dilated and inflamed left Wharton's
duct. Persistent extensive surrounding phlegmon within the left
floor of mouth contiguous with left submandibular space
inflammation.

Persistent extensive inflammation of the left submandibular gland.
Suspicion for 1.9 cm abscess within the posterior aspect of the
gland. Additional smaller low-density foci within the left
submandibular gland may reflect dilated submandibular ducts or
smaller abscess components. Persistent cellulitis/phlegmon
surrounding the left submandibular gland and extending to the left
perimandibular region.

Upper cervical lymphadenopathy, likely reactive.
